# Patient Record
Sex: Male | Born: 1935 | Race: Asian | Hispanic: No | Marital: Married | Smoking: Former smoker
Health system: Southern US, Community
[De-identification: ages and names within clinical notes are randomized; demographics above are authoritative.]

## PROBLEM LIST (undated history)

## (undated) DIAGNOSIS — C61 Malignant neoplasm of prostate: Secondary | ICD-10-CM

## (undated) DIAGNOSIS — I712 Thoracic aortic aneurysm, without rupture, unspecified: Secondary | ICD-10-CM

## (undated) DIAGNOSIS — I639 Cerebral infarction, unspecified: Secondary | ICD-10-CM

## (undated) DIAGNOSIS — I7100 Dissection of unspecified site of aorta: Secondary | ICD-10-CM

## (undated) DIAGNOSIS — I10 Essential (primary) hypertension: Secondary | ICD-10-CM

## (undated) HISTORY — DX: Thoracic aortic aneurysm, without rupture, unspecified: I71.20

## (undated) HISTORY — PX: OTHER SURGICAL HISTORY: SHX169

## (undated) HISTORY — DX: Essential (primary) hypertension: I10

## (undated) HISTORY — DX: Cerebral infarction, unspecified: I63.9

## (undated) HISTORY — DX: Thoracic aortic aneurysm, without rupture: I71.2

## (undated) HISTORY — DX: Malignant neoplasm of prostate: C61

## (undated) HISTORY — PX: EYE SURGERY: SHX253

## (undated) HISTORY — DX: Dissection of unspecified site of aorta: I71.00

---

## 1994-08-27 DIAGNOSIS — I639 Cerebral infarction, unspecified: Secondary | ICD-10-CM

## 1994-08-27 HISTORY — DX: Cerebral infarction, unspecified: I63.9

## 1998-09-26 ENCOUNTER — Other Ambulatory Visit: Admission: RE | Admit: 1998-09-26 | Discharge: 1998-09-26 | Payer: Self-pay | Admitting: Obstetrics & Gynecology

## 1999-02-20 ENCOUNTER — Emergency Department (HOSPITAL_COMMUNITY): Admission: EM | Admit: 1999-02-20 | Discharge: 1999-02-20 | Payer: Self-pay | Admitting: Emergency Medicine

## 1999-05-26 ENCOUNTER — Ambulatory Visit (HOSPITAL_COMMUNITY): Admission: RE | Admit: 1999-05-26 | Discharge: 1999-05-26 | Payer: Self-pay | Admitting: *Deleted

## 2000-10-09 ENCOUNTER — Encounter: Payer: Self-pay | Admitting: Urology

## 2000-10-10 ENCOUNTER — Ambulatory Visit (HOSPITAL_COMMUNITY): Admission: RE | Admit: 2000-10-10 | Discharge: 2000-10-10 | Payer: Self-pay | Admitting: Urology

## 2000-10-10 ENCOUNTER — Encounter: Payer: Self-pay | Admitting: Urology

## 2000-10-18 ENCOUNTER — Encounter: Admission: RE | Admit: 2000-10-18 | Discharge: 2000-10-18 | Payer: Self-pay | Admitting: Urology

## 2000-10-18 ENCOUNTER — Encounter: Payer: Self-pay | Admitting: Urology

## 2002-09-23 ENCOUNTER — Encounter: Payer: Self-pay | Admitting: Internal Medicine

## 2002-09-23 ENCOUNTER — Encounter: Admission: RE | Admit: 2002-09-23 | Discharge: 2002-09-23 | Payer: Self-pay | Admitting: Internal Medicine

## 2004-08-24 ENCOUNTER — Emergency Department (HOSPITAL_COMMUNITY): Admission: EM | Admit: 2004-08-24 | Discharge: 2004-08-24 | Payer: Self-pay | Admitting: Emergency Medicine

## 2006-05-24 ENCOUNTER — Ambulatory Visit (HOSPITAL_COMMUNITY): Admission: RE | Admit: 2006-05-24 | Discharge: 2006-05-24 | Payer: Self-pay | Admitting: *Deleted

## 2006-05-24 ENCOUNTER — Encounter (INDEPENDENT_AMBULATORY_CARE_PROVIDER_SITE_OTHER): Payer: Self-pay | Admitting: Specialist

## 2007-07-27 ENCOUNTER — Ambulatory Visit: Payer: Self-pay | Admitting: Thoracic Surgery (Cardiothoracic Vascular Surgery)

## 2007-07-27 ENCOUNTER — Ambulatory Visit: Payer: Self-pay | Admitting: Cardiology

## 2007-07-27 ENCOUNTER — Inpatient Hospital Stay (HOSPITAL_COMMUNITY): Admission: EM | Admit: 2007-07-27 | Discharge: 2007-08-05 | Payer: Self-pay | Admitting: Emergency Medicine

## 2007-07-28 ENCOUNTER — Encounter (INDEPENDENT_AMBULATORY_CARE_PROVIDER_SITE_OTHER): Payer: Self-pay | Admitting: Cardiology

## 2007-09-29 ENCOUNTER — Encounter
Admission: RE | Admit: 2007-09-29 | Discharge: 2007-09-29 | Payer: Self-pay | Admitting: Thoracic Surgery (Cardiothoracic Vascular Surgery)

## 2007-09-29 ENCOUNTER — Ambulatory Visit: Payer: Self-pay | Admitting: Thoracic Surgery (Cardiothoracic Vascular Surgery)

## 2008-04-19 ENCOUNTER — Ambulatory Visit: Payer: Self-pay | Admitting: Thoracic Surgery (Cardiothoracic Vascular Surgery)

## 2008-04-19 ENCOUNTER — Encounter
Admission: RE | Admit: 2008-04-19 | Discharge: 2008-04-19 | Payer: Self-pay | Admitting: Thoracic Surgery (Cardiothoracic Vascular Surgery)

## 2008-08-05 ENCOUNTER — Ambulatory Visit (HOSPITAL_COMMUNITY): Admission: RE | Admit: 2008-08-05 | Discharge: 2008-08-05 | Payer: Self-pay | Admitting: *Deleted

## 2008-08-05 ENCOUNTER — Encounter (INDEPENDENT_AMBULATORY_CARE_PROVIDER_SITE_OTHER): Payer: Self-pay | Admitting: *Deleted

## 2008-10-25 ENCOUNTER — Ambulatory Visit: Payer: Self-pay | Admitting: Thoracic Surgery (Cardiothoracic Vascular Surgery)

## 2008-10-25 ENCOUNTER — Encounter
Admission: RE | Admit: 2008-10-25 | Discharge: 2008-10-25 | Payer: Self-pay | Admitting: Thoracic Surgery (Cardiothoracic Vascular Surgery)

## 2009-11-21 ENCOUNTER — Encounter
Admission: RE | Admit: 2009-11-21 | Discharge: 2009-11-21 | Payer: Self-pay | Admitting: Thoracic Surgery (Cardiothoracic Vascular Surgery)

## 2009-11-21 ENCOUNTER — Ambulatory Visit: Payer: Self-pay | Admitting: Thoracic Surgery (Cardiothoracic Vascular Surgery)

## 2010-06-21 ENCOUNTER — Ambulatory Visit: Payer: Self-pay | Admitting: Cardiovascular Disease

## 2010-06-22 ENCOUNTER — Ambulatory Visit: Payer: Self-pay | Admitting: Cardiovascular Disease

## 2010-06-27 ENCOUNTER — Encounter: Admission: RE | Admit: 2010-06-27 | Discharge: 2010-06-27 | Payer: Self-pay | Admitting: Cardiovascular Disease

## 2010-09-17 ENCOUNTER — Encounter: Payer: Self-pay | Admitting: Thoracic Surgery (Cardiothoracic Vascular Surgery)

## 2010-10-19 ENCOUNTER — Other Ambulatory Visit: Payer: Self-pay | Admitting: Thoracic Surgery (Cardiothoracic Vascular Surgery)

## 2010-10-19 DIAGNOSIS — I719 Aortic aneurysm of unspecified site, without rupture: Secondary | ICD-10-CM

## 2010-11-06 ENCOUNTER — Other Ambulatory Visit: Payer: Self-pay

## 2010-11-06 ENCOUNTER — Encounter: Payer: Self-pay | Admitting: Thoracic Surgery (Cardiothoracic Vascular Surgery)

## 2010-12-08 ENCOUNTER — Other Ambulatory Visit: Payer: Self-pay | Admitting: Thoracic Surgery (Cardiothoracic Vascular Surgery)

## 2010-12-08 DIAGNOSIS — I719 Aortic aneurysm of unspecified site, without rupture: Secondary | ICD-10-CM

## 2010-12-11 ENCOUNTER — Ambulatory Visit
Admission: RE | Admit: 2010-12-11 | Discharge: 2010-12-11 | Disposition: A | Discharge status: Home / Self Care | Payer: Medicare Other | Source: Ambulatory Visit | Attending: Thoracic Surgery (Cardiothoracic Vascular Surgery) | Admitting: Thoracic Surgery (Cardiothoracic Vascular Surgery)

## 2010-12-11 ENCOUNTER — Encounter (INDEPENDENT_AMBULATORY_CARE_PROVIDER_SITE_OTHER): Payer: Medicare Other | Admitting: Thoracic Surgery (Cardiothoracic Vascular Surgery)

## 2010-12-11 DIAGNOSIS — I719 Aortic aneurysm of unspecified site, without rupture: Secondary | ICD-10-CM

## 2010-12-11 DIAGNOSIS — I7101 Dissection of thoracic aorta: Secondary | ICD-10-CM

## 2010-12-11 MED ORDER — IOHEXOL 350 MG/ML SOLN
100.0000 mL | Freq: Once | INTRAVENOUS | Status: AC | PRN
  Administered 2010-12-11: 100 mL via INTRAVENOUS

## 2010-12-12 NOTE — Assessment & Plan Note (Signed)
OFFICE VISIT  Dwayne Morris, Dwayne Morris DOB:  19-May-1927                                        December 11, 2010 CHART #:  [id number]  The patient comes in today for 1-year followup.  He is status post an acute type B aortic dissection in November 2008.  He was last seen in our office on November 21, 2009.  He has remained stable since his last office visit.  He sees Dr. Elease Hashimoto about every 6 months and his blood pressures have been very well controlled, so much so that he has reduced his antihypertensive medication regimen to just labetalol at this point. He denies any chest discomfort or shortness of breath.  He does have complaint of some vague back discomfort, but this is variable not associated with any specific activity and is nonspecific to any certain area in his back.  He remains active, swimming and walking every other day.  PHYSICAL EXAMINATION:  Vital Signs:  Blood pressure is 117/65, pulse is 58, respirations 16.  Heart:  Regular rate and rhythm without murmurs, rubs or gallops.  Lungs:  Clear to auscultation.  Abdomen:  Soft and nontender.  Extremities:  Pulses are palpable in his lower extremities with no significant lower extremity edema bilaterally.  CT angiogram of the chest today shows complete resolution of his type B dissection.  There is no change in the size of the aorta since his last scan.  ASSESSMENT AND PLAN:  The patient has remained stable from his aortic dissection.  Dr. [name] saw the patient today and reviewed his films.  The vague back discomfort he has been experiencing does not sound to be related to his aorta at all.  His blood pressures are well controlled and he remains active and asymptomatic.  We will plan to see him back in 2 years with a repeat CT angiogram at that time to follow up on his tight B dissection.  Coral Ceo, P.A.  GC/MEDQ  D:  12/11/2010  T:  12/12/2010  Job:  [id number]  cc:   Vesta Mixer, M.D. Massie Maroon,  MD

## 2010-12-18 ENCOUNTER — Other Ambulatory Visit: Payer: Self-pay | Admitting: *Deleted

## 2010-12-18 DIAGNOSIS — E785 Hyperlipidemia, unspecified: Secondary | ICD-10-CM

## 2010-12-18 MED ORDER — CHOLINE FENOFIBRATE 135 MG PO CPDR: 135.0000 mg | DELAYED_RELEASE_CAPSULE | Freq: Every day | ORAL | Status: DC

## 2010-12-18 NOTE — Telephone Encounter (Signed)
Fax received from pharmacy. Refill completed. Jodette Skai Lickteig RN  

## 2011-01-09 NOTE — Discharge Summary (Signed)
NAMERICHARDSON, Dwayne Morris                  ACCOUNT NO.:  [id number]   MEDICAL RECORD NO.:  [id number]          PATIENT TYPE:  INP   LOCATION:  2010                         FACILITY:  [facility]   PHYSICIAN:  Vesta Mixer, M.D. DATE OF BIRTH:  1927/05/25   DATE OF ADMISSION:  07/27/2007  DATE OF DISCHARGE:                               DISCHARGE SUMMARY   DISCHARGE DIAGNOSES:  1. Aortic dissection - type 3.  2. Hypertension.  3. 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  4. Mild dyslipidemia.   DISCHARGE MEDICATIONS:  1. Lipitor 20 mg a day.  2. Aspirin 81 mg a day.  3. Labetalol 200 mg three times a day.  4. Amlodipine 2.5 mg a day.  5. Darvocet N-100 one tablet twice a day as needed for pain.  6. MiraLax 17 grams at night or Metamucil one spoon in water at      bedtime for constipation.   DISPOSITION:  The patient will see Dr. Elease Hashimoto in 1-2 weeks.  He is to  call for an appointment.  He is to see Dr. [name] for follow-up in 4-6  weeks.   HISTORY:  [name] is a 77 year old gentleman with a history of mild  hypertension.  He was admitted to discharge service after having  episodes of chest pain and being discovered to have an aortic  dissection.  Please see dictated H&P for further details.   HOSPITAL COURSE:  #1- AORTIC DISSECTION.  The patient has spiral CT in  the in the emergency room.  He was found to have a dissection which  started at just distal to the left subclavian artery.  It extended down  into the iliac arteries.  The patient was treated aggressively with  labetalol and nitro drip for his blood pressure.  He stabilized, but did  not have any further episodes of pain.  Indeed, there was some initial  thrombosis of the false lumen on the initial CT scan.   Follow-up CT scan performed several days later revealed a marked  improvement of the true lumen with continued thrombosis of the false  lumen.  The patient did quite well and was his  blood pressure and heart  rate stayed quite well-controlled.  On December 8, the patient had some  recurrent episodes of back pain and chest discomfort.  He was monitored  one additional day.  He remained clinically stable and his pulses  remained intact.  He is discharged now in satisfactory condition.  The  patient will follow-up with Dr. Elease Hashimoto in 1-2 weeks.  Follow-up with Dr.  [name] in several weeks.  We anticipate a repeat scan in approximately 6  weeks.   #2 - CHEST PAIN.  The patient had intermittent episodes of chest  discomfort through the hospitalization.  He had serial cardiac enzymes  which were negative.  His EKG also was completely normal.   #3 - AORTIC INSUFFICIENCY.  The patient was incidentally noted to have  aortic insufficiency with an echocardiogram.  This will be followed up  by Dr. Elease Hashimoto.           ______________________________  Vesta Mixer, M.D.     PJN/MEDQ  D:  08/05/2007  T:  08/05/2007  Job:  [id number]   cc:   Salvatore Decent. [name], M.D.  [employer]

## 2011-01-09 NOTE — Op Note (Signed)
Dwayne Morris, CHAVOUS                  ACCOUNT NO.:  [id number]   MEDICAL RECORD NO.:  [id number]          PATIENT TYPE:  AMB   LOCATION:  ENDO                         FACILITY:  [facility]   PHYSICIAN:  Georgiana Spinner, M.D.    DATE OF BIRTH:  10-26-1926   DATE OF PROCEDURE:  DATE OF DISCHARGE:                               OPERATIVE REPORT   PROCEDURE:  Colonoscopy.   INDICATIONS:  Colon polyps, colon cancer screening.   ANESTHESIA:  Fentanyl 12.5 mcg, Versed 1 mg.   PROCEDURE:  With the patient mildly sedated in the left lateral  decubitus position, the Pentax videoscopic pediatric colonoscope was  inserted in the rectum and passed under direct vision with pressure  applied to reach the cecum, identified by the ileocecal valve and  appendiceal orifice, both which were photographed.  From this point the  colonoscope was slowly withdrawn, taking circumferential views of  colonic mucosa, stopping at the head of the splenic flexure area, where  a polyp was seen, photographed, and removed using hot biopsy forceps  technique with a setting of 20/150 blended current.  We next stopped  just a slight distance distal to this in the descending colon, where a  second polyp was seen.  It, too, was photographed and were removed using  snare cautery technique, again at a setting of 20/150 blended current.  Tissue was retrieved by taking a biopsy forceps and retrieving it into  the scope.  The endoscope was then withdrawn all the way to the rectum,  which appeared normal on direct and showed hemorrhoids on retroflexed  view.  The endoscope was straightened and withdrawn.  The patient's  vital signs, pulse oximeter remained stable.  The patient tolerated the  procedure well without apparent complications.   FINDINGS:  Internal hemorrhoids, polyp of the descending colon, polyp of  the splenic flexure.   PLAN:  Await biopsy report.  The patient will call me for results and  follow up with me as an  outpatient.           ______________________________  Georgiana Spinner, M.D.     GMO/MEDQ  D:  08/05/2008  T:  08/05/2008  Job:  [id number]

## 2011-01-09 NOTE — Assessment & Plan Note (Signed)
OFFICE VISIT   Dwayne, Morris  DOB:  02-27-1927                                        November 21, 2009  CHART #:  [id number]   HISTORY:  The patient returns for routine followup and annual  surveillance status post acute type B aortic dissection in November  2008.  He was last seen here in the office on October 25, 2008.  Since  then, he has done well clinically and he specifically reports no new  medical problems or complaints.  He remains active physically and he  enjoys his normal exercise tolerance.  He has not had any problems with  exertional shortness of breath or chest pain.  He has not had any  significant pain in his chest or back that could conceivably be related  to his thoracic aorta.  He otherwise feels quite well and has been  getting along nicely.  He reports that he continues to monitor his blood  pressure and he has not had any problems with blood pressure control.  The remainder of his review of systems is unremarkable.  The remainder  of his past medical history is unchanged.   CURRENT MEDICATIONS:  1. Aspirin 81 mg daily.  2. Labetalol 200 mg 3 times daily.  3. Avodart 0.5 mg daily.  4. Nexium 40 mg daily.  5. Vitamin C and multivitamin daily.  6. Trilipix 1 tablet daily.   PHYSICAL EXAMINATION:  Notable for well-appearing male with blood  pressure 127/65, pulse 60, and oxygen saturation 98% on room air.  HEENT  exam is unrevealing.  Auscultation of the chest reveals clear breath  sounds which are symmetrical bilaterally.  Cardiovascular exam is  notable for regular rate and rhythm.  No murmurs, rubs, or gallops are  noted.  The abdomen is soft and nontender.  Pulses are palpable in all 4  extremities and symmetrical.  The remainder of his physical exam is  noncontributory.   DIAGNOSTIC TESTS:  CT angiogram of the chest and abdomen performed  earlier today at [facility] is reviewed.  This  demonstrates stable  radiographic appearance of the entire thoracic and  abdominal aorta.  There is mild aneurysmal dilatation of the ascending  thoracic aorta and transverse aorta.  There has been no interval change  in size at all with maximum transverse diameter of the proximal aorta  measured between 4.5 and 4.6 cm.  The descending thoracic aorta remains  stable and in particular, there remains no physical sign of the  patient's previous aortic dissection.  The false lumen healed and  obliterated in the past and there remains no evidence that the patient  ever had an aortic dissection as he originally presented with in 2008.  The patient has a stable hemangioma in the liver.  No other  abnormalities are noted.   IMPRESSION:  Stable clinical course and radiographic appearance of the  entire thoracic and abdominal aorta.  There is mild aneurysmal  enlargement of the ascending thoracic aorta and transverse aortic arch  with no interval change in size.  The patient's previous type B aortic  dissection has healed completely.   PLAN:  We will have the patient return in 1 year's time for routine  followup and CT angiogram.   Salvatore Decent. [name], M.D.  Electronically Signed   CHO/MEDQ  D:  11/21/2009  T:  11/21/2009  Job:  [id number]   cc:   Vesta Mixer, M.D.  Massie Maroon, MD

## 2011-01-09 NOTE — H&P (Signed)
Dwayne Morris, Dwayne Morris                  ACCOUNT NO.:  [id number]   MEDICAL RECORD NO.:  [id number]          PATIENT TYPE:  INP   LOCATION:  2906                         FACILITY:  [facility]   PHYSICIAN:  Reginia Forts, MD     DATE OF BIRTH:  [birth year]   DATE OF ADMISSION:  07/27/2007  DATE OF DISCHARGE:                              HISTORY & PHYSICAL   ADDENDUM:  Bedside limited transthoracic echo was performed and  demonstrated an ejection fraction of approximately 60-65% with mild  aortic insufficiency.  A trace localized pericardial effusion is noted  in the subcostal views, but not seen in apical or parasternal views.  There is no evidence of tamponade.      Reginia Forts, MD  Electronically Signed     RA/MEDQ  D:  07/27/2007  T:  07/28/2007  Job:  [id number]

## 2011-01-09 NOTE — Assessment & Plan Note (Signed)
OFFICE VISIT   TEION, BALLIN  DOB:  05-17-1927                                        September 29, 2007  CHART #:  [id number]   HISTORY OF PRESENT ILLNESS:  Mr. Dwayne Morris returns for followup related to  recently discovered acute type B aortic dissection.  He was hospitalized  on July 27, 2007, with sudden onset of chest pain.  Chest CT scan  revealed an acute type B aortic dissection.  He has been treated  medically and done well.  He returns to the office for further followup  today.  He continues to follow up with Dr. Elease Hashimoto, who has been managing  his anti-hypertensive medications.  Mr. Walrath reports feeling well.  He  has not had pain for several weeks.  He is back to fairly normal  activity at this point although he has been careful to avoid any  straining or strenuous activity.  He has been keeping close monitor of  his blood pressure control and he has done well.  Overall he feels well  and has no complaints.  The remainder of his review of systems is  notable for the absence of any pain in the chest or back.  He has not  had any shortness of breath.  His past medical history is unchanged from  previously.   CURRENT MEDICATIONS:  1. Labetalol 200 mg 3 times daily.  2. Amlodipine 2.5 mg daily.  3. Aspirin 81 mg daily.  4. Lipitor 20 mg daily.   PHYSICAL EXAMINATION:  GENERAL:  Notable for a well-appearing male.  VITAL SIGNS:  Blood pressure 104/58, pulse 60, oxygen saturation 94% on  room air.  HEENT:  Unrevealing.  CHEST:  Auscultation of the chest demonstrates clear breath sounds which  are symmetrical bilaterally.  CARDIOVASCULAR:  Reveals regular rate and rhythm.  No murmurs, rubs or  gallops are noted.  ABDOMEN:  Soft, nontender, nondistended.  EXTREMITIES:  Pulses are easily palpable in all 4 extremities with  palpable pulses in the dorsalis pedis and the posterior tibial position.   The remainder of his physical examination is  unrevealing.   DIAGNOSTIC TESTS:  CT angiogram of the thoracic and abdominal aorta  performed today is reviewed and compared with the previous scan from  July 31, 2007.  Today's scan demonstrates stable radiographic  appearance of type B aortic dissection with further thrombosis of  portions of the false lumen of the descending thoracic aorta.  There has  been no sign of any aneurysmal enlargement.  One can appreciate several  areas where contrast penetrates through intimal tears into the false  lumen, but the majority of the false lumen has now thrombosed.  There is  mild-to-moderate stable aneurysmal enlargement of the ascending thoracic  aorta which measures 4.8 cm in its greatest transverse diameter.  No  other abnormalities are noted.   IMPRESSION:  Stable radiographic appearance of type B (DeBakey type III)  aortic dissection.  Mr. Motyka is doing well in medical therapy.  His  blood pressure is under good control.   PLAN:  We will plan to see Mr. Matusek back for further followup and a  repeat CT angiogram in 6 months.  He has been counseled regarding the  need to continue to closely monitor his blood pressure indefinitely.  I  think he can  start to progress with physical activity, although I have  advised him to avoid any heavy lifting or straining indefinitely in the  future.  His blood pressure remains under good control, and we will  continue to defer his long-term medical treatment to Dr. Elease Hashimoto and Dr.  Selena Batten.   Salvatore Decent. [name], M.D.  Electronically Signed   CHO/MEDQ  D:  09/29/2007  T:  09/29/2007  Job:  [id number]   cc:   Vesta Mixer, M.D.  Dr. Selena Batten

## 2011-01-09 NOTE — Consult Note (Signed)
NAMEDAMION, KANT                  ACCOUNT NO.:  [id number]   MEDICAL RECORD NO.:  [id number]          PATIENT TYPE:  INP   LOCATION:  2906                         FACILITY:  [facility]   PHYSICIAN:  Reginia Forts, MD     DATE OF BIRTH:  15-May-1927   DATE OF CONSULTATION:  DATE OF DISCHARGE:                                 CONSULTATION   CHIEF COMPLAINT:  Back pain.   HISTORY OF PRESENT ILLNESS:  Mr. Noble is a 77 year old [place] male with  a history of CVA who presents with 5 hours of mid back and chest pain.  Patient denies any prior symptoms.  Today, upon returning home from  church in the afternoon, patient developed 8-9 out of 10 back pain  radiating to his mid sternum.  Patient thought that this may be a heart  attack and called EMS.  He was subsequently transported to the [facility] Emergency Room.  Initial cardiac enzymes and EKG were unremarkable.  CT chest and abdomen demonstrated a type III aortic dissection with  thrombus in the false lumen.  Dissection extends from just proximal to  the subclavian artery down to the iliacs.  Based on these findings, CT  surgery was consulted along with the hospitalists.  After discussion,  the decision was made for the patient to be admitted to cardiology for  close hemodynamic monitoring and aggressive blood pressure control with  CT surgery following as consultants.  Patient was placed on  nitroglycerin drip with improvement in his symptoms, down to 1 out of  10.  Patient denies any abdominal pain, weakness, loss of consciousness,  orthopnea, paroxysmal nocturnal dyspnea, or shortness of breath.   PAST MEDICAL HISTORY:  1. Stroke in 1996 with residual mild left leg numbness.  2. History of right frontal scalp trauma secondary in 2005.  3. History of nephrolithiasis.  4. Colonic polyps.   ALLERGIES:  No known drug allergies.   MEDICATIONS:  Aspirin 81 mg.   SOCIAL HISTORY:  Patient lives in [place] with his wife.  He is a  retired Pharmacist, hospital.  He has 2 children, a son who is a  physician and a daughter who is a professor at [employer].  Denies any tobacco use, he has occasional wine, and denies any drug use.  He swims everyday for the last 30 years.   FAMILY HISTORY:  Negative for any cardiovascular disease.   REVIEW OF SYSTEMS:  Notable for mild left leg numbness.  Rest of the 12  review of systems were reviewed and is negative.   PHYSICAL EXAM:  Temperature 97.4.  Pulse 62.  Respiratory rate 18.  Blood pressure 128/66.  In general, patient is awake, alert, oriented x3, in no acute distress,  pleasant and communicative.  HEENT:  Normocephalic, atraumatic.  Pupils:  Equal, round, and reactive  to light.  Extraocular movements are intact.  Neck shows no bruits, no JVD with brisk carotid upstrokes.  Lymph demonstrates no lymphadenopathy.  CARDIOVASCULAR:  Regular rhythm, normal rate with a 1 out of 6 systolic  ejection murmur at  the right upper sternal border.  There is no  diastolic murmur.  Lungs are clear to auscultation bilaterally.  ABDOMEN:  Positive bowel sounds.  Soft, nontender, nondistended.  Extremities show no cyanosis, clubbing, or edema.  There are 3+ femoral  pulses and 3+ distal pulses with no subclavian bruits.  Musculoskeletal demonstrates no joint deformities or effusions.  Skin demonstrates no rashes or lesions.  NEURO:  Cranial nerves II-XII grossly intact.  No focal motor or sensory  deficits.  Psych demonstrates normal affect.   DATA:  Chest x-ray demonstrates cardiomegaly.  CT of the chest and  abdomen demonstrates a type III aortic dissection from the proximal  transverse thoracic aorta to the iliacs with a thrombosed false lumen  with severe compression of the contrast-filled lumen in the distal  thoracic and proximal and mid abdominal aorta.  The true lumen is patent  in that region.  An ascending aortic aneurysm is also noted with a  measurement of  4.7 cm.  EKG demonstrates normal sinus rhythm with a  heart rate of 75 with no ST-T wave changes or hypertrophy.   LABS:  BUN 12, creatinine 0.9.  White count of 9.9, hemoglobin of 16,  and platelet of 205,000.  Troponin was less than 0.05.  CK is 66.2, MB  is 1.4.   ASSESSMENT/PLAN:  This is a 77 year old [place] male with a newly  diagnosed type III aortic dissection, which appears to be stable but  quite significant.  Dissection:  Patient will be treated conservatively.  At this time,  there is no acute surgical indication.  CT surgery will follow.  Patient  will be continued on nitroglycerin drip and started on labetalol p.o.  and a Statin.  Patient will also continue his aspirin.  We will obtain  an echocardiogram to rule out aortic regurgitation and/or a evolving  pericardial effusion.  Patient will be admitted to the CCU for close  monitoring and aggressive blood pressure control.      Reginia Forts, MD  Electronically Signed     RA/MEDQ  D:  07/27/2007  T:  07/28/2007  Job:  [id number]

## 2011-01-09 NOTE — Consult Note (Signed)
NAMEANNE, SEBRING                  ACCOUNT NO.:  [id number]   MEDICAL RECORD NO.:  [id number]          PATIENT TYPE:  EMS   LOCATION:  MAJO                         FACILITY:  [facility]   PHYSICIAN:  Salvatore Decent. [name], M.D. DATE OF BIRTH:  October 23, 1926   DATE OF CONSULTATION:  07/27/2007  DATE OF DISCHARGE:                                 CONSULTATION   REQUESTING PHYSICIAN:  Dr. Benjiman Core in the emergency department.   PRIMARY CARE PHYSICIAN:  Dr. Selena Batten at [facility].   REASON FOR CONSULTATION:  Acute type B aortic dissection.   HISTORY OF PRESENT ILLNESS:  Mr. 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  The patient  otherwise remains remarkably active physically.  He has been in his  usual state of health until approximately 2:00 this afternoon, when he  developed sudden onset of severe piercing chest pain radiating straight  through to his back.  The pain increased in severity, became associated  with shortness of breath.  The patient took a few aspirin and called  EMS, and was brought directly to the emergency department.  Upon  arrival, blood pressure was 157/71 with pulse 69.  Baseline  electrocardiograms were without acute ischemic changes and cardiac  enzymes are not elevated.  CT angiogram of the chest was performed,  demonstrating acute type B aortic dissection.  Cardiothoracic surgical  consultation was requested.   REVIEW OF SYSTEMS:  The patient reports absolutely no problems until his  sudden onset of pain at 2 p.m. this afternoon.  The patient's appetite  had been normal recently.  He remains quite active physically and  exercises regularly, including swimming and long walks.  The patient  specifically denies any previous history of chest pain, chest  tightness,  chest pressure or shortness of breath either with activity or at rest.  The patient denies any fevers or chills.  His appetite has been normal.  His bowel function has been normal.  He has no other complaints.   PAST MEDICAL HISTORY:  1. Borderline hypertension.  2. Dissection of right vertebral artery with associated stroke      approximately 12 years ago.   PAST SURGICAL HISTORY:  None.   SOCIAL HISTORY:  The patient is married and lives with his wife here in  [place].  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  He lives a  very healthy lifestyle.  He has one son who is a Development worker, community at [facility] in [place].  He is a nonsmoker.  He drinks occasional  glass of wine.   MEDICATIONS PRIOR TO ADMISSION:  Aspirin 81 mg daily.   DRUG ALLERGIES:  None known.   PHYSICAL EXAM:  The patient is well-appearing [place] male who appears  younger than stated age, in no acute distress.  Most recently, blood pressure is 128/66, pulse 62, respirations 14,  unlabored.  He is afebrile.  HEENT EXAM:  Unrevealing.  NECK:  Supple.  There are no carotid bruits.  There is no jugular venous  distention.  Auscultation of chest reveals clear breath sounds which are symmetrical  bilaterally.  No wheezes or rhonchi are demonstrated.  CARDIOVASCULAR EXAM:  Regular rate and rhythm.  No murmurs, rubs or  gallops are noted.  ABDOMEN:  Soft, nontender.  Bowel sounds are present.  EXTREMITIES:  Warm and well-perfused.  There is no lower extremity  edema.  All pulses in all 4 extremities are strong and easily  appreciated including both radial pulses, both femoral pulses, and  dorsalis pedis and posterior tibial pulses in both lower legs.  SKIN:  Clean, dry, healthy-appearing throughout.  RECTAL AND GU EXAMS:  Both deferred.  NEUROLOGIC EXAMINATION:  Grossly nonfocal and symmetrical throughout.   DIAGNOSTIC TESTS:  CT angiogram of the chest performed this afternoon   demonstrates acute type B aortic dissection.  There is moderate  aneurysmal fusiform dilatation of the ascending thoracic aorta with  maximum transverse diameter approximately 4.4 centimeters.  The  dissection begins just after takeoff of the left subclavian artery, and  extends down past the aortic bifurcation.  It appears that the false  lumen may in fact already be thrombosed, or at least very under  opacified with contrast on this injection.  Both renal arteries and all  mesenteric vessels arise from the true lumen.  No other abnormalities  are noted, and in particular, there are no pleural effusions or other  signs complication.  The descending thoracic aorta is not aneurysmal  enlarged.   IMPRESSION:  Acute type B aortic dissection with possible early  thrombosis of the false lumen that extends from the left subclavian to  the level of the aortic bifurcation.  There is no sign of compromise to  any of the mesenteric or renal vessels, and the patient is clinically  stable at present with no signs of these possible ischemic complication.   PLAN:  I recommend hospital admission and intensive care monitoring for  strict blood pressure control with use of beta-blockers as the mainstay  for blood pressure control initially.  Routine 2-D echocardiogram may be  reasonable as well, given the patient's dilated ascending thoracic aorta  and the potential for an associated bicuspid aortic valve, although the  patient does not have a murmur on physical exam.  We will continue to  follow along closely and consider transfer to a facility where the stent  grafting of complicated type B aortic dissections can be accomplished,  should the patient develop any significant complications related to this  condition.  Given the appearance on CT angiogram, I suspect this would  be unlikely, and the overall prognosis is probably relatively favorable.      Salvatore Decent. [name], M.D.  Electronically  Signed     CHO/MEDQ  D:  07/27/2007  T:  07/28/2007  Job:  [id number]   cc:   [facility] Dr. Selena Batten

## 2011-01-09 NOTE — Op Note (Signed)
NAMEGASPARD, ISBELL                  ACCOUNT NO.:  [id number]   MEDICAL RECORD NO.:  [id number]          PATIENT TYPE:  AMB   LOCATION:  ENDO                         FACILITY:  [facility]   PHYSICIAN:  Georgiana Spinner, M.D.    DATE OF BIRTH:  08/07/1927   DATE OF PROCEDURE:  08/05/2008  DATE OF DISCHARGE:                               OPERATIVE REPORT   PROCEDURE:  Upper endoscopy.   INDICATIONS:  GERD.   ANESTHESIA:  Fentanyl 37.5 mcg, Versed 3 mg.   PROCEDURE IN DETAIL:  With the patient mildly sedated in the left  lateral decubitus position the Pentax videoscopic endoscope was inserted  in the mouth, passed under direct vision through the esophagus which  appeared normal except there was a possible small island of Barrett's  photographed and biopsied.  We entered into the stomach, fundus was  erythematous and was photographed and biopsied.  Body and antrum were  normal as was duodenal bulb, second portion of duodenum.  From this  point the endoscope was slowly withdrawn taking circumferential views of  duodenal mucosa until the endoscope was pulled back into the stomach,  placed in retroflexion to view the stomach from below.  The endoscope  was straightened and withdrawn taking circumferential views of remaining  gastric and esophageal mucosa.  The patient's vital signs, pulse  oximeter remained stable.  The patient tolerated procedure well without  apparent complications.   FINDINGS:  Erythematous stomach in the fundus biopsied and question of a  small island of Barrett's esophagus biopsied.  Await biopsy report.  The  patient will call me for results and follow up with me as an outpatient.  Proceed to colonoscopy as planned.           ______________________________  Georgiana Spinner, M.D.     GMO/MEDQ  D:  08/05/2008  T:  08/05/2008  Job:  [id number]

## 2011-01-09 NOTE — Assessment & Plan Note (Signed)
OFFICE VISIT   Dwayne Morris, STANCO  DOB:  [birth year]                                        April 19, 2008  CHART #:  [id number]   HISTORY OF PRESENT ILLNESS:  The patient returns for routine followup  and surveillance of type B aortic dissection.  He was originally  hospitalized in November 2008 with acute type B aortic dissection.  Since then, we have been following him with serial CT angiograms.  His  last visit was in February 2009.  Since then, the patient has continued  to do well clinically.  He reports that he has had this vague mild  discomfort in his back that has persisted ever since his original acute  presentation.  He otherwise has been asymptomatic, and his original  problems with pain resolved.  He is back to reasonably normal activity.  He avoids any heavy lifting or straining.  He is back to swimming and  walking on a regular basis.  He is getting along quite well.  He has had  no interval new problems or complaints other than the fact that he has  developed elevated PSA level and he has seen Dr. Bjorn Pippin and  tentatively plans to undergo biopsy of his prostate gland.   CURRENT MEDICATIONS:  Labetalol 200 mg 3 times daily, aspirin 81 mg  daily, and Lipitor 20 mg daily.   PHYSICAL EXAMINATION:  GENERAL:  A well-appearing [place] gentleman.  VITAL SIGNS:  Blood pressure 117/70, pulse 72 and regular.  HEENT:  Unrevealing.  CHEST:  Auscultation of the chest demonstrates clear breath sounds which  are symmetrical.  CARDIOVASCULAR:  Regular rate and rhythm.  No murmurs, rubs, or gallops  are noted.  ABDOMEN:  Soft, nontender.  EXTREMITIES:  Warm and well perfused.  Pulses are palpable.   DIAGNOSTIC TESTS:  CT angiogram performed today is reviewed and compared  with the previous scan from February 2009.  Today's scan demonstrates  stable radiographic appearance of mild aneurysmal enlargement of the  ascending thoracic aorta and entirely stable  type B aortic dissection.  The false lumen thrombosed and now appears to have essentially healed  completely.  There remains a small eccentric outpouching of the proximal  descending thoracic aorta that has not changed in size and appears more  smooth in contour.  This presumably corresponds to the original site of  aortic dissection.  No other abnormalities are noted.   IMPRESSION:  Stable course was mild aneurysmal enlargement of the  ascending thoracic aorta and chronic type B aortic dissection with  thrombosed false lumen that has now essentially healed completely.  Overall, things look quite stable.   PLAN:  We will have the patient return in 6 months for followup CT  angiogram.   Salvatore Decent. [name], M.D.  Electronically Signed   CHO/MEDQ  D:  04/19/2008  T:  04/20/2008  Job:  [id number]   cc:   Vesta Mixer, M.D.  Massie Maroon, MD

## 2011-01-09 NOTE — Assessment & Plan Note (Signed)
OFFICE VISIT   Dwayne Morris, Dwayne Morris  DOB:  10-21-1926                                        October 25, 2008  CHART #:  [id number]   The patient returns to the office today for routine followup and  surveillance having originally presented with acute type B aortic  dissection in November 2008.  He was last seen here in the office on  April 19, 2008.  Since then, he has done well, although he has recently  been diagnosed with prostate cancer.  He otherwise, feels well and he  has no other physical problems or complaints.  He denies any pain in  chest or back that could be related to his aorta.  He has not had any  shortness of breath.  He remains quite active physically both walking  regularly and swimming on a regular basis.  He has no other complaints.  The remainder of his past medical history is unchanged.  The remainder  of his review of systems is unchanged.   CURRENT MEDICATIONS:  Lipitor 20 mg daily, aspirin 81 mg daily,  labetalol 200 mg 3 times daily, Darvocet as needed for pain, MiraLax as  needed for constipation, Avodart 0.5 mg daily, Nexium 40 mg daily,  vitamin C daily, and Mucinex daily as needed.   PHYSICAL EXAMINATION:  GENERAL:  Notable for a well-appearing male.  VITAL SIGNS:  Blood pressure 134/76, pulse 61, and oxygen saturation 94%  on room air.  HEENT:  Unrevealing.  NECK:  Supple.  There are no carotid bruits.  CHEST:  Auscultation of the chest reveals clear breath sounds that are  symmetrical bilaterally.  CARDIOVASCULAR:  Demonstrates regular rate and rhythm.  No murmurs,  rubs, or gallops are noted.  ABDOMEN:  Soft, nontender.  EXTREMITIES:  Warm and well perfused.  Distal pulses are palpable in all  4 extremities.   DIAGNOSTIC TEST:  CT angiogram of the thoracic and abdominal aorta  performed today is reviewed and compared with the last previous exam  from April 19, 2008.  This demonstrates stable radiographic appearance  of the  thoracic and abdominal aorta.  There is mild aneurysmal  enlargement of the ascending thoracic aorta measuring approximately 4.5  cm in his greatest transverse dimension.  The descending thoracic aorta  is normal in caliber.  The old false lumen has thrombosed and healed  completely, and in fact, there is no longer any radiographic evidence  for previous aortic dissection present.  There is some mild ulceration  in the transverse thoracic aorta.   IMPRESSION:  The patient is doing quite well from the standpoint of his  previous aortic dissection.  The false lumen thrombosed and healed, and  the size of his aorta remains stable.  His blood pressure remains under  reasonably good control, although his blood pressure is a little bit  higher today than has been on previous office visits.   PLAN:  We will plan to see the patient, returned for follow up with a  repeat CT angiogram in 1 year.  He has been reminded that it will remain  important for him to keep his blood pressure under good control and keep  an eye on it.  We will leave any further adjustments in his medical  therapy to Dr. Elease Hashimoto and Dr. Selena Batten.   Salvatore Decent.  [name], M.D.  Electronically Signed   CHO/MEDQ  D:  10/25/2008  T:  10/25/2008  Job:  [id number]   cc:   Vesta Mixer, M.D.  Massie Maroon, MD

## 2011-01-12 NOTE — Op Note (Signed)
[facility]  Patient:    Dwayne Morris, Dwayne Morris                         MRN: [id number] Proc. Date: 10/10/00 Adm. Date:  [id number] Attending:  Evlyn Clines CC:         Janae Bridgeman. Eloise Harman., M.D.   Operative Report  PROCEDURES: 1. Cystoscopy. 2. Bilateral retrograde pyelograms with interpretation.  PREOPERATIVE DIAGNOSES: 1. Hematuria. 2. Left distal ureteral stone.  POSTOPERATIVE DIAGNOSES: 1. Hematuria with interval passage of left distal ureteral stone. 2. Benign prostatic hypertrophy.  SURGEON:  Excell Seltzer. Annabell Howells, M.D.  ANESTHESIA:  General.  COMPLICATIONS:  None.  INDICATIONS:  Mr. Herder is a 77 year old [place]-American male who originally presented with hematuria.  Office evaluation suggested a left distal ureteral stone.  The patient had minimal pain, but reported this morning that has had some recent left lower quadrant pain.  A KUB preoperatively on one oblique view suggested the stone was likely still in the ureter; however, it was not 100% clear.  It was felt we should proceed with cystoscopy and retrograde pyelograms to further evaluate the urinary tract.  FINDINGS OF PROCEDURE:  The patient was given p.o. Tequin.  He was taken to the operating room, where general anesthetic was induced.  He was placed in the lithotomy position, his perineum and genitalia were prepped with Betadine solution and he was draped in the usual sterile fashion.  Cystoscopy was performed using a 22-French scope and 12-degree and 70-degree lenses.  Examination revealed a normal urethra and intact external sphincter.  The prostatic urethra was 3-4 cm in length, with lateral lobe hyperplasia and obstruction.  There was minimal middle lobe.  Examination of the bladder revealed the ureteral orifices in the normal anatomic position.  There was moderate trabeculation of the bladder wall.  No tumors, stones or inflammation were noted.  The left ureteral orifice  was cannulated with a 5-French opening catheter; contrast was instilled in retrograde fashion.  There was no obvious filling defect in the distal ureter, where the stone had been noted on IVP; however, there was a mobile filling defect in the mid ureter, it was felt to be most likely bubbles (although a small free-floating stone could not be entirely ruled out).  The remainder of the ureter was unremarkable.  The intrauterine collecting system was delicate without hydronephrosis or filling defects.  After completion of pyelogram, observation of the system demonstrated free drainage.  The mobile filling defect moved quite readily, and I felt was most suspicious for a bubble.  Once again, a stone cannot be entirely ruled out.  The right system was studied as well because of the patients hematuria.  This revealed a normal ureter and intrarenal collecting system without filling defects, hydronephrosis or other abnormalities.  Two calcifications in the right upper quadrant, which are nonurinary.  Because of the filling defect in the left ureter, I felt that it would be worthwhile to pass the basket just to ensure no obvious stones were there.  A ______ Hermelinda Medicus basket with a filiform tip was passed easily to the kidney. The basket was opened and withdrawn with a twisting motion.  A small amount of tissue debris was removed with the basket, but no stone was retrieved.  The second pass was made once again with a small amount of tissue debris, but no stone.  At this point the bladder was drained.  The patients anesthetic was reversed. He  was moved to the recovery room in stable condition.  There were no complications. DD:  10/10/00 TD:  10/10/00 Job: [id number]

## 2011-01-12 NOTE — Op Note (Signed)
NAMEQUANTRELL, Dwayne Morris                  ACCOUNT NO.:  [id number]   MEDICAL RECORD NO.:  [id number]          PATIENT TYPE:  AMB   LOCATION:  ENDO                         FACILITY:  [facility]   PHYSICIAN:  Georgiana Spinner, M.D.    DATE OF BIRTH:  01-07-1927   DATE OF PROCEDURE:  DATE OF DISCHARGE:                                 OPERATIVE REPORT   PROCEDURE:  Colonoscopy.   INDICATIONS:  Colon polyps.   ANESTHESIA:  Fentanyl 70 mcg, Versed 5 mg.   PROCEDURE:  With the patient mildly sedated in the left lateral decubitus  position, the Olympus videoscopic colonoscope was inserted into the rectum  after a rectal examination revealed a large smooth prostate.  Subsequently,  with pressure applied to the abdomen, the patient rolled to the back, right  side and back to his left side, we were able with pressure to reach the  cecum identified by the base of the cecum and ileocecal valve.  At the base  of cecum was a small polyp that was removed using hot biopsy forceps  technique, setting of 20/200 blended current.  We next stopped in the  ascending colon where three other polyps were seen and removed, one with  snare cautery technique and the others with hot biopsy forceps technique,  all with the same setting of 20/200 blended current.  All of the  tissue was  retrieved for pathology and placed in one specimen container.  We then  withdrew the colonoscope, taking circumferential views of the remaining  colonic mucosa, stopping only in the rectum which appeared normal on direct  and showed hemorrhoids on retroflexed view.  The endoscope was straightened  and withdrawn.  The patient's vital signs and pulse oximeter remained  stable.  The patient tolerated procedure well without apparent  complications.   FINDINGS:  Polyps of the cecum and right colon, await biopsy report.  The  patient will call me for results and follow up with me as an outpatient.  Internal hemorrhoids also noted.     ______________________________  Georgiana Spinner, M.D.     GMO/MEDQ  D:  05/24/2006  T:  05/26/2006  Job:  [id number]

## 2011-01-12 NOTE — H&P (Signed)
Dwayne Morris, Dwayne Morris                  ACCOUNT NO.:  [id number]   MEDICAL RECORD NO.:  [id number]          PATIENT TYPE:  INP   LOCATION:  2010                         FACILITY:  [facility]   PHYSICIAN:  Darryl D. Prime, MD    DATE OF BIRTH:  [birth year]   DATE OF ADMISSION:  07/27/2007  DATE OF DISCHARGE:  08/05/2007                              HISTORY & PHYSICAL   PHONE NOTE  The patient called at approximately 11:30 p.m. on August 27, 2007 with  concerns with elevated blood pressure.  Apparently he had a family  feud and was very angry this evening.  He notes his blood pressure a  few minutes before calling was 155/62.  He has a history of type B  aortic dissection, per the patient.  Per the discharge summary, on  August 05, 2007, he has a type 3 aortic dissection.  The patient is on  amlodipine 2.5 mg daily and labetalol 200 mg 3 times a day.  He took the  labetalol around 7 p.m. earlier today.  The patient was instructed to  take an additional 200 mg of labetalol and check his blood pressure an  hour later and call.  He did call approximately 5 minutes after 1 and  notes his blood pressure was 110/57.  He still will continue to monitor  this, particularly if he remains upset, but if his blood pressure  remains well controlled he will continue his regimen as previously  prescribed.      Darryl D. Prime, MD  Electronically Signed     DDP/MEDQ  D:  08/28/2007  T:  08/28/2007  Job:  [id number]

## 2011-05-16 ENCOUNTER — Encounter: Payer: Self-pay | Admitting: *Deleted

## 2011-05-25 ENCOUNTER — Ambulatory Visit: Payer: Medicare Other | Admitting: Cardiovascular Disease

## 2011-06-04 LAB — CARDIAC PANEL(CRET KIN+CKTOT+MB+TROPI)
Relative Index: 1.4
Relative Index: INVALID
Total CK: 53
Troponin I: 0.01
Troponin I: 0.02

## 2011-06-04 LAB — COMPREHENSIVE METABOLIC PANEL
ALT: 35
CO2: 27
Calcium: 8.5
Creatinine, Ser: 0.71
GFR calc non Af Amer: >60
Glucose, Bld: 171 — ABNORMAL HIGH
Sodium: 137

## 2011-06-04 LAB — BASIC METABOLIC PANEL
BUN: 13
CO2: 26
CO2: 28
Calcium: 9
Chloride: 105
Creatinine, Ser: 0.84
Creatinine, Ser: 0.93
GFR calc Af Amer: >60
Glucose, Bld: 106 — ABNORMAL HIGH

## 2011-06-04 LAB — LIPID PANEL: Triglycerides: 54

## 2011-06-04 LAB — B-NATRIURETIC PEPTIDE (CONVERTED LAB): Pro B Natriuretic peptide (BNP): 53

## 2011-06-04 LAB — CBC
Hemoglobin: 15.1
MCHC: 34.4
MCV: 94.9
RBC: 4.62

## 2011-06-04 LAB — HEMOGLOBIN A1C: Mean Plasma Glucose: 119

## 2011-06-05 ENCOUNTER — Encounter: Payer: Self-pay | Admitting: Cardiovascular Disease

## 2011-06-05 ENCOUNTER — Ambulatory Visit (INDEPENDENT_AMBULATORY_CARE_PROVIDER_SITE_OTHER): Payer: Medicare Other | Admitting: Cardiovascular Disease

## 2011-06-05 VITALS — BP 106/62 | HR 61 | Ht 64.0 in | Wt 144.8 lb

## 2011-06-05 DIAGNOSIS — I7100 Dissection of unspecified site of aorta: Secondary | ICD-10-CM

## 2011-06-05 DIAGNOSIS — I71019 Dissection of thoracic aorta, unspecified: Secondary | ICD-10-CM | POA: Insufficient documentation

## 2011-06-05 DIAGNOSIS — I7101 Dissection of thoracic aorta: Secondary | ICD-10-CM

## 2011-06-05 DIAGNOSIS — E785 Hyperlipidemia, unspecified: Secondary | ICD-10-CM

## 2011-06-05 LAB — POCT CARDIAC MARKERS
Myoglobin, poc: 66.2
Operator id: 272551

## 2011-06-05 LAB — CBC
HCT: 44.5
MCV: 93.9
RBC: 4.73
WBC: 9.9

## 2011-06-05 LAB — DIFFERENTIAL
Eosinophils Absolute: 0 — ABNORMAL LOW
Eosinophils Relative: 0
Lymphs Abs: 2
Monocytes Relative: 6
Neutrophils Relative %: 73

## 2011-06-05 LAB — I-STAT 8, (EC8 V) (CONVERTED LAB)
Chloride: 103
HCT: 47
Potassium: 3.4 — ABNORMAL LOW
pH, Ven: 7.339 — ABNORMAL HIGH

## 2011-06-05 LAB — POCT I-STAT CREATININE: Creatinine, Ser: 0.9

## 2011-06-05 MED ORDER — LABETALOL HCL 100 MG PO TABS: 100.0000 mg | ORAL_TABLET | Freq: Two times a day (BID) | ORAL | Status: DC

## 2011-06-05 NOTE — Patient Instructions (Signed)
Your physician wants you to follow-up in:6 months You will receive a reminder letter in the mail two months in advance. If you don't receive a letter, please call our office to schedule the follow-up appointment.  Your physician recommends that you return for lab work tomorrow for fasting labs and in 6 months

## 2011-06-05 NOTE — Assessment & Plan Note (Signed)
Dwayne Morris is doing very well. [name] been walking and/or swimming almost every day of week. He has been gradually tapering his labetalol slightly because of some levels of hypertension. He'll hold his labetalol if his systolic blood pressures less than 90.  At this point I would like for him to stand labetalol 100 mg twice a day. This will help prevent him from having another aortic dissection. I'm pleased that he keeps such meticulous records for our review.

## 2011-06-05 NOTE — Progress Notes (Signed)
Dwayne Morris Date of Birth  Feb 22, 1927 [facility] [address]    [address] [place], [place]  [address] [phone]  Fax  [phone]  History of Present Illness:  77 year old with hx of type B  aortic dissection treated medically .  He is very well. He keeps meticulous records regarding his blood pressure. He has been on labetalol and has had very well controlled blood pressure.  Recently his blood pressure levels have been decreasing. He has gradually decreased his dose of labetalol and has kept up with his blood pressure readings continuously.  He walks on a regular basis approximately 3 miles a day.  He is also swimming 3 times a week.  He has not lost any weight. He he eats a very healthy diet and does not eat a lot of salt.  He complains of some tingling particularly in his left foot.  Current Outpatient Prescriptions on File Prior to Visit  Medication Sig Dispense Refill  . Ascorbic Acid (VITAMIN C PO) Take 1 tablet by mouth daily.        Marland Kitchen aspirin 81 MG tablet Take 81 mg by mouth daily.        . Choline Fenofibrate 135 MG capsule Take 1 capsule (135 mg total) by mouth daily.  30 capsule  11  . dutasteride (AVODART) 0.5 MG capsule Take 0.5 mg by mouth daily.        Marland Kitchen esomeprazole (NEXIUM) 40 MG capsule Take 40 mg by mouth daily before breakfast.        . labetalol (NORMODYNE) 200 MG tablet Take 100 mg by mouth 2 (two) times daily.       . multivitamin (THERAGRAN) per tablet Take 1 tablet by mouth daily.        . Folic Acid-Vit B6-Vit B12 (FOLBEE) 2.5-25-1 MG TABS Take 1 tablet by mouth daily.          No Known Allergies  Past Medical History  Diagnosis Date  . Hypertension   . Aortic dissection     type B aortic dissection in November 2008  . Prostate cancer   . Stroke 1996    History of right vertebral artery dissection with stroke    No past surgical history on file.  History  Smoking status  . Former Smoker -- 1.0 packs/day for 30 years  . Types:  Cigarettes  . Quit date: 05/15/1974  Smokeless tobacco  . Not on file    History  Alcohol Use No    No family history on file.  Reviw of Systems:  Reviewed in the HPI.  All other systems are negative.  Physical Exam: BP 106/62  Pulse 61  Ht 5\' 4"  (1.626 m)  Wt 144 lb 12.8 oz (65.681 kg)  BMI 24.85 kg/m2 The patient is alert and oriented x 3.  The mood and affect are normal.   Skin: warm and dry.  Color is normal.    HEENT:   the sclera are nonicteric.  The mucous membranes are moist.  The carotids are 2+ without bruits.  There is no thyromegaly.  There is no JVD.    Lungs: clear.  The chest wall is non tender.    Heart: regular rate with a normal S1 and S2.  There are no murmurs, gallops, or rubs. The PMI is not displaced.     Abdomen: good bowel sounds.  There is no guarding or rebound.  There is no hepatosplenomegaly or tenderness.  There are no  masses.   Extremities:  no clubbing, cyanosis, or edema.  The legs are without rashes.  The distal pulses are intact.   Neuro:  Cranial nerves II - XII are intact.  Motor and sensory functions are intact.    The gait is normal.  ECG:  Assessment / Plan:

## 2011-06-06 ENCOUNTER — Other Ambulatory Visit (INDEPENDENT_AMBULATORY_CARE_PROVIDER_SITE_OTHER): Payer: Medicare Other | Admitting: *Deleted

## 2011-06-06 DIAGNOSIS — E785 Hyperlipidemia, unspecified: Secondary | ICD-10-CM

## 2011-06-06 DIAGNOSIS — I7100 Dissection of unspecified site of aorta: Secondary | ICD-10-CM

## 2011-06-06 DIAGNOSIS — I7101 Dissection of thoracic aorta: Secondary | ICD-10-CM

## 2011-06-06 LAB — HEPATIC FUNCTION PANEL
ALT: 24 U/L (ref 0–53)
AST: 25 U/L (ref 0–37)
Alkaline Phosphatase: 41 U/L (ref 39–117)
Bilirubin, Direct: 0.1 mg/dL (ref 0.0–0.3)
Total Bilirubin: 0.5 mg/dL (ref 0.3–1.2)

## 2011-06-06 LAB — BASIC METABOLIC PANEL
BUN: 26 mg/dL — ABNORMAL HIGH (ref 6–23)
Calcium: 8.7 mg/dL (ref 8.4–10.5)
Creatinine, Ser: 1 mg/dL (ref 0.4–1.5)
GFR: 77.71 mL/min (ref >60.00)
Glucose, Bld: 95 mg/dL (ref 70–99)
Sodium: 141 mEq/L (ref 135–145)

## 2011-06-06 LAB — LIPID PANEL: HDL: 53.5 mg/dL (ref >39.00)

## 2011-06-13 ENCOUNTER — Telehealth: Payer: Self-pay | Admitting: *Deleted

## 2011-06-13 NOTE — Telephone Encounter (Signed)
Message copied by Eugenia Pancoast on Wed Jun 13, 2011  9:41 AM ------      Message from: Clay, Deloris Ping      Created: Sun Jun 10, 2011  9:55 PM       Very good

## 2011-06-13 NOTE — Telephone Encounter (Signed)
Advised of labs and mailed copy 

## 2011-07-18 ENCOUNTER — Other Ambulatory Visit: Payer: Self-pay | Admitting: Cardiovascular Disease

## 2011-07-25 ENCOUNTER — Other Ambulatory Visit: Payer: Self-pay | Admitting: *Deleted

## 2011-07-25 MED ORDER — LABETALOL HCL 100 MG PO TABS: 100.0000 mg | ORAL_TABLET | Freq: Two times a day (BID) | ORAL | Status: DC

## 2011-09-14 ENCOUNTER — Inpatient Hospital Stay (HOSPITAL_COMMUNITY)
Admission: EM | Admit: 2011-09-14 | Discharge: 2011-09-19 | DRG: 921 | Disposition: A | Discharge status: Home / Self Care | Payer: Medicare Other | Attending: Gastroenterology | Admitting: Gastroenterology

## 2011-09-14 ENCOUNTER — Encounter (HOSPITAL_COMMUNITY)
Admission: EM | Disposition: A | Discharge status: Home / Self Care | Payer: Self-pay | Source: Home / Self Care | Attending: Gastroenterology

## 2011-09-14 ENCOUNTER — Encounter (HOSPITAL_COMMUNITY): Payer: Self-pay

## 2011-09-14 DIAGNOSIS — Z7982 Long term (current) use of aspirin: Secondary | ICD-10-CM

## 2011-09-14 DIAGNOSIS — Z8546 Personal history of malignant neoplasm of prostate: Secondary | ICD-10-CM

## 2011-09-14 DIAGNOSIS — Z87891 Personal history of nicotine dependence: Secondary | ICD-10-CM

## 2011-09-14 DIAGNOSIS — Z79899 Other long term (current) drug therapy: Secondary | ICD-10-CM

## 2011-09-14 DIAGNOSIS — Z23 Encounter for immunization: Secondary | ICD-10-CM

## 2011-09-14 DIAGNOSIS — Y849 Medical procedure, unspecified as the cause of abnormal reaction of the patient, or of later complication, without mention of misadventure at the time of the procedure: Secondary | ICD-10-CM | POA: Diagnosis present

## 2011-09-14 DIAGNOSIS — IMO0002 Reserved for concepts with insufficient information to code with codable children: Principal | ICD-10-CM | POA: Diagnosis present

## 2011-09-14 DIAGNOSIS — K922 Gastrointestinal hemorrhage, unspecified: Secondary | ICD-10-CM

## 2011-09-14 DIAGNOSIS — N400 Enlarged prostate without lower urinary tract symptoms: Secondary | ICD-10-CM | POA: Diagnosis present

## 2011-09-14 DIAGNOSIS — I10 Essential (primary) hypertension: Secondary | ICD-10-CM | POA: Diagnosis present

## 2011-09-14 DIAGNOSIS — Z8673 Personal history of transient ischemic attack (TIA), and cerebral infarction without residual deficits: Secondary | ICD-10-CM

## 2011-09-14 DIAGNOSIS — K219 Gastro-esophageal reflux disease without esophagitis: Secondary | ICD-10-CM | POA: Diagnosis present

## 2011-09-14 HISTORY — PX: COLONOSCOPY: SHX5424

## 2011-09-14 LAB — BASIC METABOLIC PANEL
Chloride: 108 mEq/L (ref 96–112)
Creatinine, Ser: 0.75 mg/dL (ref 0.50–1.35)
GFR calc Af Amer: >90 mL/min (ref >90)
Potassium: 4.4 mEq/L (ref 3.5–5.1)
Sodium: 136 mEq/L (ref 135–145)

## 2011-09-14 LAB — TYPE AND SCREEN
ABO/RH(D): AB POS
Antibody Screen: NEGATIVE
Unit division: 0
Unit division: 0

## 2011-09-14 LAB — ABO/RH: ABO/RH(D): AB POS

## 2011-09-14 LAB — CBC
Hemoglobin: 10.1 g/dL — ABNORMAL LOW (ref 13.0–17.0)
RBC: 3.19 MIL/uL — ABNORMAL LOW (ref 4.22–5.81)
WBC: 8.1 10*3/uL (ref 4.0–10.5)

## 2011-09-14 LAB — MRSA PCR SCREENING: MRSA by PCR: NEGATIVE

## 2011-09-14 SURGERY — COLONOSCOPY
Anesthesia: Moderate Sedation

## 2011-09-14 MED ORDER — MIDAZOLAM HCL 5 MG/5ML IJ SOLN
INTRAMUSCULAR | Status: DC | PRN
  Administered 2011-09-14: 2 mg via INTRAVENOUS

## 2011-09-14 MED ORDER — SODIUM CHLORIDE 0.9 % IV SOLN
INTRAVENOUS | Status: DC
  Administered 2011-09-14: 22:00:00 via INTRAVENOUS

## 2011-09-14 MED ORDER — FENTANYL CITRATE 0.05 MG/ML IJ SOLN
INTRAMUSCULAR | Status: AC
  Filled 2011-09-14: qty 2

## 2011-09-14 MED ORDER — MIDAZOLAM HCL 10 MG/2ML IJ SOLN
INTRAMUSCULAR | Status: AC
  Filled 2011-09-14: qty 2

## 2011-09-14 MED ORDER — EPINEPHRINE HCL 0.1 MG/ML IJ SOLN
INTRAMUSCULAR | Status: AC
  Filled 2011-09-14: qty 10

## 2011-09-14 MED ORDER — FENTANYL NICU IV SYRINGE 50 MCG/ML
INJECTION | INTRAMUSCULAR | Status: DC | PRN
  Administered 2011-09-14: 25 ug via INTRAVENOUS
  Administered 2011-09-14: 20 ug via INTRAVENOUS

## 2011-09-14 MED ORDER — SODIUM CHLORIDE 0.9 % IJ SOLN
INTRAMUSCULAR | Status: DC | PRN
  Administered 2011-09-14: 22:00:00

## 2011-09-14 MED ORDER — PEG 3350-KCL-NA BICARB-NACL 420 G PO SOLR
4000.0000 mL | Freq: Once | ORAL | Status: DC
  Filled 2011-09-14: qty 4000

## 2011-09-14 MED ORDER — SODIUM CHLORIDE 0.9 % IV SOLN
INTRAVENOUS | Status: DC
  Administered 2011-09-15 – 2011-09-18 (×5): via INTRAVENOUS

## 2011-09-14 NOTE — H&P (Signed)
Dwayne Morris is an 78 y.o. male.   Chief Complaint: Hematochezia HPI: This is an 78 year old gentleman who is admitted to the hospital for a post polypectomy bleed.  He underwent a colonoscopy yesterday with findings of a 1.5-2 cm sessile cecal polyp.  It was removed in a piecemeal fashion in two passes.  No immediate bleeding occurred and prophylactically three hemoclips were placed across the lesion.  It was technically difficult to place, but I felt that the clips were successful in closing the lesion.  He was well until 3 PM today when he complained of a bout of large hematochezia.  Upon follow up at 5 PM the patient reported 3 other bloody bowel movements and mild dizziness with standing.  At that time he was instructed to present to the hospital for admission.  No complaints of chest pain or SOB.  No dizziness in a recumbent position.  Past Medical History  Diagnosis Date  . Hypertension   . Aortic dissection     type B aortic dissection in November 2008  . Prostate cancer     PSA levels have decreased with garlic therapy  . Stroke 1996    History of right vertebral artery dissection with stroke    History reviewed. No pertinent past surgical history.  History reviewed. No pertinent family history. Social History:  reports that he quit smoking about 37 years ago. His smoking use included Cigarettes. He has a 30 pack-year smoking history. He does not have any smokeless tobacco history on file. He reports that he does not drink alcohol or use illicit drugs.  Allergies: No Known Allergies  No current facility-administered medications on file as of 09/14/2011.   Medications Prior to Admission  Medication Sig Dispense Refill  . Ascorbic Acid (VITAMIN C PO) Take 1 tablet by mouth daily.        Marland Kitchen aspirin 81 MG tablet Take 81 mg by mouth daily.        . Calcium Carbonate-Vitamin D (CALCIUM + D PO) Take 1 tablet by mouth daily.       . Choline Fenofibrate 135 MG capsule Take 135 mg by  mouth daily.      Marland Kitchen dutasteride (AVODART) 0.5 MG capsule Take 0.5 mg by mouth daily.        Marland Kitchen esomeprazole (NEXIUM) 40 MG capsule Take 40 mg by mouth daily before breakfast.        . Folic Acid-Vit B6-Vit B12 (FOLBEE) 2.5-25-1 MG TABS Take 1 tablet by mouth daily.        Marland Kitchen labetalol (NORMODYNE) 100 MG tablet Take 100 mg by mouth 2 (two) times daily.      Marland Kitchen VITAMIN E PO Take 1 tablet by mouth daily.         No results found for this or any previous visit (from the past 48 hour(s)). No results found.  ROS:  As stated above in the HPI, otherwise negative.  General appearance: alert and no distress Resp: clear to auscultation bilaterally GI: soft, non-tender; bowel sounds normal; no masses,  no organomegaly Extremities: extremities normal, atraumatic, no cyanosis or edema Blood pressure 98/56, pulse 64, temperature 96.9 F (36.1 C), temperature source Oral, resp. rate 16, SpO2 97.00%.   Assessment/Plan 1) Post polypectomy bleed.   Shortly after my assessment of the patient he had a bowel movement.  At that time he had a near syncopal event.  He was placed in his bed in a [place] position.  He started  to regain consciousness, but he was weak.  IV fluids were already going in wide open and a second IV line was started.  Two units of O Neg blood was immediately ordered.  After one unit of PRBC his blood pressured improved from the upper 90's to a systolic of 122.  Heart rate never changed from the mid 60's.  He denied any chest pain or SOB, but he was cold.  His admission bed status was changed over from a Stepdown bed to an ICU bed.  Initially he was to have a colonoscopy tomorrow morning, but with the severity of his bleed a colonoscopy will be performed emergently.  Plan: 1) Emergent colonoscopy.  Sayre Mazor D 09/14/2011, 6:41 PM

## 2011-09-14 NOTE — ED Notes (Signed)
Blood Bank contacted for emergency release.

## 2011-09-14 NOTE — ED Notes (Signed)
MD ordered to transfuse blood at 941ml/hr, due to emergent need of pt. VO VBR.

## 2011-09-14 NOTE — ED Notes (Addendum)
Pt arrived to ED at direct admit. Pt was having rectal bleeding and MD was at bedside. MD allowed pt to get up and use restroom, while on toilet pt experienced a near syncopal event and pt was assisted back to bed by MD and EMT and this RN. Pt was placed in reverse trendelenberg, and 2 bags of NS were hung wide open. MD ordered for emergency release blood and ordered for blood to be hung at bolus rate of 999 ml/hr. Pt did not experience any transfusion reactions while in ED. Pt was alert and oriented, but very diaphoretic and weak. MD was at bedside entire time. Pt BP began to rise and pt began to feel less weak after 1st unit infused. 2nd unit was started in ED and pt was then brought up to 2106. Report was called ahead of time to [place], Charity fundraiser.

## 2011-09-14 NOTE — ED Notes (Signed)
Pt is direct admit to be seen for GI bleed. Admitting MD at bedside

## 2011-09-15 DIAGNOSIS — K922 Gastrointestinal hemorrhage, unspecified: Secondary | ICD-10-CM

## 2011-09-15 LAB — CBC
HCT: 32.6 % — ABNORMAL LOW (ref 39.0–52.0)
HCT: 33 % — ABNORMAL LOW (ref 39.0–52.0)
HCT: 33.5 % — ABNORMAL LOW (ref 39.0–52.0)
HCT: 36.6 % — ABNORMAL LOW (ref 39.0–52.0)
Hemoglobin: 11.1 g/dL — ABNORMAL LOW (ref 13.0–17.0)
Hemoglobin: 11.6 g/dL — ABNORMAL LOW (ref 13.0–17.0)
Hemoglobin: 11.7 g/dL — ABNORMAL LOW (ref 13.0–17.0)
Hemoglobin: 12.7 g/dL — ABNORMAL LOW (ref 13.0–17.0)
MCH: 31.9 pg (ref 26.0–34.0)
MCHC: 34 g/dL (ref 30.0–36.0)
MCHC: 34.7 g/dL (ref 30.0–36.0)
MCV: 90.2 fL (ref 78.0–100.0)
MCV: 91.3 fL (ref 78.0–100.0)
Platelets: 152 10*3/uL (ref 150–400)
Platelets: 148 10*3/uL — ABNORMAL LOW (ref 150–400)
Platelets: 159 10*3/uL (ref 150–400)
RBC: 3.62 MIL/uL — ABNORMAL LOW (ref 4.22–5.81)
RBC: 3.66 MIL/uL — ABNORMAL LOW (ref 4.22–5.81)
RBC: 3.67 MIL/uL — ABNORMAL LOW (ref 4.22–5.81)
RBC: 3.71 MIL/uL — ABNORMAL LOW (ref 4.22–5.81)
RBC: 4.02 MIL/uL — ABNORMAL LOW (ref 4.22–5.81)
WBC: 8.1 10*3/uL (ref 4.0–10.5)
WBC: 6.4 10*3/uL (ref 4.0–10.5)

## 2011-09-15 LAB — BASIC METABOLIC PANEL
CO2: 21 mEq/L (ref 19–32)
Calcium: 7.2 mg/dL — ABNORMAL LOW (ref 8.4–10.5)
Chloride: 112 mEq/L (ref 96–112)
Potassium: 3.6 mEq/L (ref 3.5–5.1)
Sodium: 141 mEq/L (ref 135–145)

## 2011-09-15 LAB — GLUCOSE, CAPILLARY: Glucose-Capillary: 81 mg/dL (ref 70–99)

## 2011-09-15 MED ORDER — DUTASTERIDE 0.5 MG PO CAPS
0.5000 mg | ORAL_CAPSULE | Freq: Every day | ORAL | Status: DC
  Administered 2011-09-15 – 2011-09-19 (×5): 0.5 mg via ORAL
  Filled 2011-09-15 (×5): qty 1

## 2011-09-15 MED ORDER — CHLORHEXIDINE GLUCONATE 0.12 % MT SOLN
15.0000 mL | Freq: Two times a day (BID) | OROMUCOSAL | Status: DC
  Administered 2011-09-15 (×2): 15 mL via OROMUCOSAL
  Filled 2011-09-15 (×3): qty 15

## 2011-09-15 MED ORDER — BIOTENE DRY MOUTH MT LIQD
15.0000 mL | Freq: Two times a day (BID) | OROMUCOSAL | Status: DC
  Administered 2011-09-15: 15 mL via OROMUCOSAL

## 2011-09-15 NOTE — Consult Note (Addendum)
Name: Dwayne Morris MRN: [id number] DOB: [birth year]    LOS: 1  PCCM CONSULT  NOTE  History of Present Illness: This is an 78 year old gentleman who is admitted to the hospital for a post polypectomy bleed. He underwent a colonoscopy 09/13/11 with findings of a 1.5-2 cm sessile cecal polyp. It was removed in a piecemeal fashion in two passes. No immediate bleeding occurred and prophylactically three hemoclips were placed across the lesion. It was technically difficult to place, but It was  felt that the clips were successful in closing the lesion. He was well until 3 PM 09/14/11 when he complained of a bout of large hematochezia. Upon follow up at 5 PM on 09/14/11 the patient reported 3 other bloody bowel movements and mild dizziness with standing. At that time he was instructed by GI Dr  Elnoria Howard to present to the hospital for admission. No complaints of chest pain or SOB. In ER he had bowel movement and had near syncope with hypotension. Emergently trnasfused and repeat colonoscopy done 09/14/11 by Dr Elnoria Howard and s/p hemocliiping. No dizziness in a recumbent position. Admiited to ICU and PCCM asked to follow 09/15/11. He is s/p 4 Units PRBC in ER 09/14/11   Lines / Drains:   Cultures:   Antibiotics: none  Tests / Events: 1/18 GIB     Past Medical History  Diagnosis Date  . Hypertension   . Aortic dissection     type B aortic dissection in November 2008  . Prostate cancer     PSA levels have decreased with garlic therapy  . Stroke 1996    History of right vertebral artery dissection with stroke   History reviewed. No pertinent past surgical history. Prior to Admission medications   Medication Sig Start Date End Date Taking? Authorizing Provider  Ascorbic Acid (VITAMIN C PO) Take 1 tablet by mouth daily.     Yes Historical Provider, MD  aspirin 81 MG tablet Take 81 mg by mouth daily.     Yes Historical Provider, MD  atorvastatin (LIPITOR) 20 MG tablet Take 20 mg by mouth at bedtime.   Yes  Historical Provider, MD  Calcium Carbonate-Vitamin D (CALCIUM + D PO) Take 1 tablet by mouth daily.    Yes Historical Provider, MD  Choline Fenofibrate 135 MG capsule Take 135 mg by mouth daily. 12/18/10 12/18/11 Yes Elyn Aquas., MD  dutasteride (AVODART) 0.5 MG capsule Take 0.5 mg by mouth daily.     Yes Historical Provider, MD  esomeprazole (NEXIUM) 40 MG capsule Take 40 mg by mouth daily before breakfast.     Yes Historical Provider, MD  Folic Acid-Vit B6-Vit B12 (FOLBEE) 2.5-25-1 MG TABS Take 1 tablet by mouth daily.     Yes Historical Provider, MD  labetalol (NORMODYNE) 100 MG tablet Take 100 mg by mouth 2 (two) times daily. 07/25/11  Yes Elyn Aquas., MD  Multiple Vitamin (MULITIVITAMIN WITH MINERALS) TABS Take 1 tablet by mouth daily.   Yes Historical Provider, MD  VITAMIN E PO Take 1 tablet by mouth daily.    Yes Historical Provider, MD   Allergies No Known Allergies  Family History History reviewed. No pertinent family history.  Social History  reports that he quit smoking about 37 years ago. His smoking use included Cigarettes. He has a 30 pack-year smoking history. He does not have any smokeless tobacco history on file. He reports that he does not drink alcohol or use illicit drugs.  Review Of Systems  11  points review of systems is negative with an exception of listed in HPI.  Vital Signs: Temp:  [96.3 F (35.7 C)-98 F (36.7 C)] 97.9 F (36.6 C) (01/19 0400) Pulse Rate:  [56-76] 71  (01/19 0700) Resp:  [8-21] 19  (01/19 0700) BP: (38-146)/(27-67) 104/51 mmHg (01/19 0700) SpO2:  [89 %-100 %] 94 % (01/19 0700) Weight:  [141 lb 1.5 oz (64 kg)] 141 lb 1.5 oz (64 kg) (01/18 2010) I/O last 3 completed shifts: In: 5950 [I.V.:4550; Blood:1400] Out: 1900 [Urine:1900]  Physical Examination:  BP 104/51  Pulse 71  Temp(Src) 97.9 F (36.6 C) (Oral)  Resp 19  Ht 5\' 3"  (1.6 m)  Wt 64 kg (141 lb 1.5 oz)  BMI 24.99 kg/m2  SpO2 94%  General Appearance:    Alert,  cooperative, no distress, appears younger than stated age  Head:    Normocephalic, without obvious abnormality, atraumatic  Eyes:    PERRL, conjunctiva/corneas clear, EOM's intact, fundi    benign, both eyes       Ears:    Normal TM's and external ear canals, both ears  Nose:   Nares normal, septum midline, mucosa normal, no drainage   or sinus tenderness  Throat:   Lips, mucosa, and tongue normal; teeth and gums normal  Neck:   Supple, symmetrical, trachea midline, no adenopathy;       thyroid:  No enlargement/tenderness/nodules; no carotid   bruit or JVD  Back:     Symmetric, no curvature, ROM normal, no CVA tenderness  Lungs:     Clear to auscultation bilaterally, respirations unlabored  Chest wall:    No tenderness or deformity  Heart:    Regular rate and rhythm, S1 and S2 normal, no murmur, rub   or gallop  Abdomen:     Soft, non-tender, bowel sounds active all four quadrants,    no masses, no organomegaly  Genitalia:    Normal male without lesion, discharge or tenderness  Rectal:    Normal tone, normal prostate, no masses or tenderness;   guaiac negative stool  Extremities:   Extremities normal, atraumatic, no cyanosis or edema  Pulses:   2+ and symmetric all extremities  Skin:   Skin color, texture, turgor normal, no rashes or lesions  Lymph nodes:   Cervical, supraclavicular, and axillary nodes normal  Neurologic:   CNII-XII intact. Normal strength, sensation and reflexes      throughout     Ventilator settings:  none  Labs and Imaging:  Reviewed.   No results found.  Lab 09/15/11 0112 09/14/11 2223  NA 141 136  K 3.6 4.4  CL 112 108  CO2 21 20  BUN 16 18  CREATININE 0.58 0.75  GLUCOSE 104* 128*    Lab 09/15/11 0846 09/15/11 0500 09/15/11 0112  HGB 11.1* 11.3* 11.6*  HCT 32.6* 33.0* 33.3*  WBC 7.7 8.1 9.3  PLT 161 148* 152   No results found for this basename: INR, PROTIME   Assessment and Plan: GI bleed from recent polypectomy post re clipping 1/18. HD  stable. Hemostasis achieved by 1/19. PCCM to follow while in ICU -monitor serial cbc's -post clipping per GI - serial cbc's -tx to floor when stable next 24-48h; remain high risk bleed  Best practices / Disposition: -->ICU status under GI primarhy.Marland Kitchen PCCM consultiong. Keep in ICU next 24-48h due to high risk for bleeding -->full code -->scd -->Protonix for GI Px -->diet per GI svc   Dr. Kalman Shan, M.D., [facility].C.P Pulmonary and Critical  Care Medicine Staff Physician [facility] Pager: [phone], If no answer or between  15:00h - 7:00h: call [phone]  09/15/2011 10:58 AM

## 2011-09-15 NOTE — Progress Notes (Signed)
Patient ID: Dwayne Morris, male   DOB: [birth year], 78 y.o.   MRN: [id number] Subjective: No acute events overnight.  He is well at this time.  No further rectal bleeding.  Objective: Vital signs in last 24 hours: Temp:  [96.3 F (35.7 C)-98 F (36.7 C)] 97.9 F (36.6 C) (01/19 0400) Pulse Rate:  [56-76] 71  (01/19 0700) Resp:  [8-21] 19  (01/19 0700) BP: (38-146)/(27-67) 104/51 mmHg (01/19 0700) SpO2:  [89 %-100 %] 94 % (01/19 0700) Weight:  [64 kg (141 lb 1.5 oz)] 64 kg (141 lb 1.5 oz) (01/18 2010) Last BM Date: 09/14/11  Intake/Output from previous day: 01/18 0701 - 01/19 0700 In: 5950 [I.V.:4550; Blood:1400] Out: 1900 [Urine:1900] Intake/Output this shift:    General appearance: alert and no distress Resp: clear to auscultation bilaterally Cardio: regular rate and rhythm, S1, S2 normal, no murmur, click, rub or gallop GI: soft, non-tender; bowel sounds normal; no masses,  no organomegaly Extremities: extremities normal, atraumatic, no cyanosis or edema  Lab Results:  Basename 09/15/11 0500 09/15/11 0112 09/14/11 2330  WBC 8.1 9.3 9.4  HGB 11.3* 11.6* 12.7*  HCT 33.0* 33.3* 36.6*  PLT 148* 152 164   BMET  Basename 09/15/11 0112 09/14/11 2223  NA 141 136  K 3.6 4.4  CL 112 108  CO2 21 20  GLUCOSE 104* 128*  BUN 16 18  CREATININE 0.58 0.75  CALCIUM 7.2* 7.0*   LFT No results found for this basename: PROT,ALBUMIN,AST,ALT,ALKPHOS,BILITOT,BILIDIR,IBILI in the last 72 hours PT/INR No results found for this basename: LABPROT:2,INR:2 in the last 72 hours Hepatitis Panel No results found for this basename: HEPBSAG,HCVAB,HEPAIGM,HEPBIGM in the last 72 hours C-Diff No results found for this basename: CDIFFTOX:3 in the last 72 hours Fecal Lactopherrin No results found for this basename: FECLLACTOFRN in the last 72 hours  Studies/Results: No results found.  Medications:  Scheduled:   . antiseptic oral rinse  15 mL Mouth Rinse q12n4p  . chlorhexidine  15 mL Mouth  Rinse BID  . DISCONTD: polyethylene glycol-electrolytes  4,000 mL Oral Once   Continuous:   . sodium chloride 150 mL/hr at 09/15/11 0500  . DISCONTD: sodium chloride 150 mL/hr at 09/14/11 2130    Assessment/Plan: 1) Cecal post polypectomy bleed s/p hemoclipping. 2) HTN. 3) BPH. 4) History of prostate cancer. 5) GERD.   He is hemodynamically stable.  His HGB has trended downwards, but I cannot tell if he is still in the process of equilibration.  Clinically there is no evidence of any further bleeding, but the next 24 to 36 hours will determine if the acute issue has passed.    Plan: 1) Change CBC down to q8 hours. 2) Maintain ICU care for the next 24 hours. 3) Continue to hold anti-hypertensives (labetolol 200 mg TID). 4) I will restart Avodart for his urinary issues.  LOS: 1 day   Dwayne Morris D 09/15/2011, 8:22 AM

## 2011-09-16 DIAGNOSIS — K922 Gastrointestinal hemorrhage, unspecified: Secondary | ICD-10-CM

## 2011-09-16 LAB — CBC
HCT: 31.7 % — ABNORMAL LOW (ref 39.0–52.0)
HCT: 33.2 % — ABNORMAL LOW (ref 39.0–52.0)
HCT: 33.7 % — ABNORMAL LOW (ref 39.0–52.0)
Hemoglobin: 10.9 g/dL — ABNORMAL LOW (ref 13.0–17.0)
Hemoglobin: 11.5 g/dL — ABNORMAL LOW (ref 13.0–17.0)
Hemoglobin: 11.5 g/dL — ABNORMAL LOW (ref 13.0–17.0)
MCH: 31.2 pg (ref 26.0–34.0)
MCH: 31.5 pg (ref 26.0–34.0)
MCHC: 34.4 g/dL (ref 30.0–36.0)
MCHC: 34.6 g/dL (ref 30.0–36.0)
MCV: 90.8 fL (ref 78.0–100.0)
MCV: 92.3 fL (ref 78.0–100.0)
Platelets: 165 10*3/uL (ref 150–400)
RBC: 3.65 MIL/uL — ABNORMAL LOW (ref 4.22–5.81)
RDW: 14.1 % (ref 11.5–15.5)

## 2011-09-16 LAB — BASIC METABOLIC PANEL
Chloride: 110 mEq/L (ref 96–112)
GFR calc Af Amer: >90 mL/min (ref >90)
Potassium: 3.4 mEq/L — ABNORMAL LOW (ref 3.5–5.1)

## 2011-09-16 MED ORDER — POTASSIUM CHLORIDE 20 MEQ/15ML (10%) PO LIQD
40.0000 meq | Freq: Once | ORAL | Status: AC
  Administered 2011-09-16: 40 meq via ORAL
  Filled 2011-09-16: qty 30

## 2011-09-16 MED ORDER — PANTOPRAZOLE SODIUM 40 MG PO TBEC
40.0000 mg | DELAYED_RELEASE_TABLET | Freq: Every day | ORAL | Status: DC
  Administered 2011-09-16 – 2011-09-19 (×4): 40 mg via ORAL
  Filled 2011-09-16 (×5): qty 1

## 2011-09-16 NOTE — Progress Notes (Signed)
Patient ID: Dwayne Morris, male   DOB: 1927-02-13, 78 y.o.   MRN: [id number] Subjective: One small maroon bowel movement last evening.  No other complaints.  Objective: Vital signs in last 24 hours: Temp:  [97.9 F (36.6 C)-98.7 F (37.1 C)] 97.9 F (36.6 C) (01/20 0400) Pulse Rate:  [59-78] 65  (01/20 0800) Resp:  [9-23] 23  (01/20 0800) BP: (93-124)/(45-66) 117/57 mmHg (01/20 0800) SpO2:  [94 %-100 %] 97 % (01/20 0800) Last BM Date: 09/15/11  Intake/Output from previous day: 01/19 0701 - 01/20 0700 In: 3470 [P.O.:1220; I.V.:2250] Out: 4076 [Urine:4075; Stool:1] Intake/Output this shift:    General appearance: alert and no distress Resp: clear to auscultation bilaterally Cardio: regular rate and rhythm, S1, S2 normal, no murmur, click, rub or gallop GI: soft, non-tender; bowel sounds normal; no masses,  no organomegaly Extremities: extremities normal, atraumatic, no cyanosis or edema  Lab Results:  Basename 09/16/11 0021 09/15/11 1617 09/15/11 0846  WBC 6.1 6.4 7.7  HGB 10.9* 11.7* 11.1*  HCT 31.7* 33.5* 32.6*  PLT 152 159 161   BMET  Basename 09/16/11 0021 09/15/11 0112 09/14/11 2223  NA 140 141 136  K 3.4* 3.6 4.4  CL 110 112 108  CO2 24 21 20   GLUCOSE 92 104* 128*  BUN 9 16 18   CREATININE 0.69 0.58 0.75  CALCIUM 7.8* 7.2* 7.0*   LFT No results found for this basename: PROT,ALBUMIN,AST,ALT,ALKPHOS,BILITOT,BILIDIR,IBILI in the last 72 hours PT/INR No results found for this basename: LABPROT:2,INR:2 in the last 72 hours Hepatitis Panel No results found for this basename: HEPBSAG,HCVAB,HEPAIGM,HEPBIGM in the last 72 hours C-Diff No results found for this basename: CDIFFTOX:3 in the last 72 hours Fecal Lactopherrin No results found for this basename: FECLLACTOFRN in the last 72 hours  Studies/Results: No results found.  Medications:  Scheduled:   . dutasteride  0.5 mg Oral Daily  . pantoprazole  40 mg Oral Q1200  . potassium chloride  40 mEq Oral Once    . DISCONTD: antiseptic oral rinse  15 mL Mouth Rinse q12n4p  . DISCONTD: chlorhexidine  15 mL Mouth Rinse BID   Continuous:   . sodium chloride 75 mL/hr at 09/15/11 2238    Assessment/Plan: 1) Cecal post polypectomy bleed s/p hemoclipping.  2) HTN.  3) BPH.  4) History of prostate cancer.  5) GERD.     He continues to do well at this time.  He had a small maroon bowel movement last evening before his 12 AM CBC.  He may have some intermittent bleeding from the cecal site.  I do not have the AM labs at this time, but I do not feel he requires further ICU care.  I will transfer him to stepdown and continue to follow his CBC.  He may have some bleeding, but with conservative measures, his body may heal up and not required further intervention.  If there is still a suspicion of bleeding, I will consider ordering a bleeding scan to confirm any bleeding from the cecal site.  If it is positive he may required IR to embolize the site.   Plan:  1) Change CBC down to q12 hours.  2) Transfer to [facility].  3) Continue to hold anti-hypertensives (labetolol 200 mg TID).  He is not hypertensive at this time.    LOS: 2 days   Dwayne Morris 09/16/2011, 8:28 AM

## 2011-09-16 NOTE — Progress Notes (Signed)
eLink Physician-Brief Progress Note Patient Name: Dwayne Morris DOB: May 28, 1927 MRN: [id number]  Date of Service  09/16/2011   HPI/Events of Note  K 3.4   eICU Interventions  Replaced with 40 meq oral.     Intervention Category Minor Interventions: Electrolytes abnormality - evaluation and management  Sulay Brymer 09/16/2011, 1:52 AM

## 2011-09-16 NOTE — Progress Notes (Signed)
Name: Dwayne Morris MRN: [id number] DOB: [birth year]    LOS: 2  PCCM PROGRESS NOTE  History of Present Illness:  This is an 78 year old gentleman with PMH HTN, Type B aortic dissection in 2008,hemorrhagic stroke and prostate Ca. who is admitted to the hospital for a post polypectomy bleed. He underwent a colonoscopy 09/13/11 with findings of a 1.5-2 cm sessile cecal polyp. It was removed in a piecemeal fashion in two passes. No immediate bleeding occurred and prophylactically three hemoclips were placed across the lesion. It was technically difficult to place, but It was felt that the clips were successful in closing the lesion. He was well until 3 PM 09/14/11 when he complained of a bout of large hematochezia. Upon follow up at 5 PM on 09/14/11 the patient reported 3 other bloody bowel movements and mild dizziness with standing. At that time he was instructed by GI Dr Elnoria Howard to present to the hospital for admission. No complaints of chest pain or SOB. In ER he had bowel movement and had near syncope with hypotension. Emergently trnasfused and repeat colonoscopy done 09/14/11 by Dr Elnoria Howard and s/p hemoclipping. No dizziness in a recumbent position. Admiited to ICU and PCCM asked to follow 09/15/11. He is s/p 4 Units PRBC in ER 09/14/11   Lines / Drains: Peripheral IV  Cultures: MRSA Neg.  Antibiotics: None  Tests / Events: 1/18 GIB, 4 units PRBC transfused.  Vital Signs: Temp:  [97.9 F (36.6 C)-98.7 F (37.1 C)] 97.9 F (36.6 C) (01/20 0400) Pulse Rate:  [59-78] 74  (01/20 0900) Resp:  [9-23] 20  (01/20 0900) BP: (93-124)/(45-66) 111/56 mmHg (01/20 0900) SpO2:  [94 %-100 %] 96 % (01/20 0900) I/O last 3 completed shifts: In: 9420 [P.O.:1220; I.V.:6800; Blood:1400] Out: 5976 [Urine:5975; Stool:1]  Physical Examination: General:  NAD Neuro:  A+Ox3,  HEENT:  PERRL Neck:  Supple, No JVD Cardiovascular:  RRR, occasional PVCs noted on monitor, No M/G/R Lungs:  B/L CTA Abdomen:  Soft, non  tender, BS x 4 active Musculoskeletal:  No edema Skin:  No rash.  Ventilator settings:  none  Labs and Imaging:  Basic Metabolic Panel:  Basename 09/16/11 0021 09/15/11 0112  NA 140 141  K 3.4* 3.6  CL 110 112  CO2 24 21  GLUCOSE 92 104*  BUN 9 16  CREATININE 0.69 0.58  CALCIUM 7.8* 7.2*  MG -- --  PHOS -- --   CBC:  Basename 09/16/11 0818 09/16/11 0021  WBC 8.4 6.1  NEUTROABS -- --  HGB 11.5* 10.9*  HCT 33.2* 31.7*  MCV 91.0 90.8  PLT 169 152   CBG:  Basename 09/15/11 1212 09/15/11 0816  GLUCAP 91 81     Assessment and Plan: # Neurology    A+Ox3, NAD. Not on sedation.  # Gastroenterology system   1. GI bleed       from recent polypectomy post re clipping 1/18. HD stable. Hemostasis achieved by 1/19. PCCM to follow while in ICU.      Last small melena noted last night. CBC stable.  Lab 09/16/11 0818 09/16/11 0021 09/15/11 1617  HGB 11.5* 10.9* 11.7*  HCT 33.2* 31.7* 33.5*  WBC 8.4 6.1 6.4  PLT 169 152 159     -monitor serial cbc's  -post clipping per GI  -tx to floor today.   # Cardiology   1. HTN,         Plan         -hold home dose of antihypertensive  therapy due to acute GIB.         - will resume once stable  # BPH with Hx of Prostate cancer.    - continue home Avodart.  Best practices / Disposition: -->ICU status under GI primary. PCCM consulting.  -->full code -->SCD for DVT Px -->Protonix for GI Px -->RA -->diet: CL and per GI -->family updated at bedside    [facility] 09/16/2011, 10:13 AM   STAFF NOTE: I, Dr Lavinia Sharps have personally reviewed patient's available data, including medical history, events of note, physical examination and test results as part of my evaluation. I have discussed with residentand other care providers such as pharmacist, RN and RRT.  In addition,  I personally evaluated patient and elicited key findings of rectal bleeding. Has maintained normal vitals signs overngith. Some maroon stools but  hgb stable as of several hours ago and again this AM. D/w Dr Elnoria Howard of GI; he can move to regular medical bed and Dr Elnoria Howard will follow with serial H and H. PCCM will sign off. Rest per medical resident whose note is outlined above and that I agree with    Dr. Kalman Shan, M.D., [facility].C.P Pulmonary and Critical Care Medicine Staff Physician  System [facility] Pager: [phone], If no answer or between  15:00h - 7:00h: call [phone]  09/16/2011 10:13 AM

## 2011-09-17 ENCOUNTER — Encounter (HOSPITAL_COMMUNITY): Payer: Self-pay | Admitting: Gastroenterology

## 2011-09-17 LAB — CBC
MCH: 31.7 pg (ref 26.0–34.0)
MCHC: 34.4 g/dL (ref 30.0–36.0)
MCV: 92 fL (ref 78.0–100.0)
Platelets: 157 10*3/uL (ref 150–400)
Platelets: 174 10*3/uL (ref 150–400)
RBC: 3.43 MIL/uL — ABNORMAL LOW (ref 4.22–5.81)
RDW: 13.9 % (ref 11.5–15.5)
RDW: 13.9 % (ref 11.5–15.5)
WBC: 6.5 10*3/uL (ref 4.0–10.5)

## 2011-09-17 LAB — BASIC METABOLIC PANEL
BUN: 11 mg/dL (ref 6–23)
CO2: 25 mEq/L (ref 19–32)
Calcium: 8 mg/dL — ABNORMAL LOW (ref 8.4–10.5)
Creatinine, Ser: 0.8 mg/dL (ref 0.50–1.35)
Glucose, Bld: 131 mg/dL — ABNORMAL HIGH (ref 70–99)

## 2011-09-17 NOTE — Progress Notes (Signed)
Patient ID: Dwayne Morris, male   DOB: 07-Aug-1927, 78 y.o.   MRN: [id number] Subjective: No acute events.  One melenic bowel movement yesterday afternoon.  Objective: Vital signs in last 24 hours: Temp:  [97.8 F (36.6 C)-98 F (36.7 C)] 98 F (36.7 C) (01/21 0519) Pulse Rate:  [63-81] 63  (01/21 0519) Resp:  [14-20] 18  (01/21 0519) BP: (91-112)/(47-57) 112/57 mmHg (01/21 0519) SpO2:  [94 %-98 %] 96 % (01/21 0519) Last BM Date: 09/16/11  Intake/Output from previous day: 01/20 0701 - 01/21 0700 In: 1140 [P.O.:240; I.V.:900] Out: 1650 [Urine:1650] Intake/Output this shift:    General appearance: alert and no distress Resp: clear to auscultation bilaterally Cardio: regular rate and rhythm, S1, S2 normal, no murmur, click, rub or gallop GI: soft, non-tender; bowel sounds normal; no masses,  no organomegaly Extremities: extremities normal, atraumatic, no cyanosis or edema  Lab Results:  Basename 09/17/11 0011 09/16/11 1540 09/16/11 0818  WBC 5.6 6.6 8.4  HGB 10.7* 11.5* 11.5*  HCT 31.1* 33.7* 33.2*  PLT 157 165 169   BMET  Basename 09/17/11 0011 09/16/11 0021 09/15/11 0112  NA 141 140 141  K 3.6 3.4* 3.6  CL 111 110 112  CO2 25 24 21   GLUCOSE 131* 92 104*  BUN 11 9 16   CREATININE 0.80 0.69 0.58  CALCIUM 8.0* 7.8* 7.2*   LFT No results found for this basename: PROT,ALBUMIN,AST,ALT,ALKPHOS,BILITOT,BILIDIR,IBILI in the last 72 hours PT/INR No results found for this basename: LABPROT:2,INR:2 in the last 72 hours Hepatitis Panel No results found for this basename: HEPBSAG,HCVAB,HEPAIGM,HEPBIGM in the last 72 hours C-Diff No results found for this basename: CDIFFTOX:3 in the last 72 hours Fecal Lactopherrin No results found for this basename: FECLLACTOFRN in the last 72 hours  Studies/Results: No results found.  Medications:  Scheduled:   . dutasteride  0.5 mg Oral Daily  . pantoprazole  40 mg Oral Q1200   Continuous:   . sodium chloride 75 mL/hr at 09/16/11  1253    Assessment/Plan: 1) Cecal post-polypectomy bleed. 2) HTN. 3) BPH.   His HGB has fluctuated, but with the report of melena yesterday I have a suspicion that he may have a very slow GI bleed.  Overall he his hemodynamically stable and tolerating PO without any difficulty.    Plan: 1) Continue hospitalization.There is a risk of a postpolypectomy ulceration opening up beyond the scope of the hemoclips that can result in a significant rebleed. 2) Continue to follow HGB. 3) No issues with HTN at this time and his antihypertensives will continue to be held. 4) Continue with Protonix for his history of GERD.  LOS: 3 days   Ranita Stjulien D 09/17/2011, 8:48 AM

## 2011-09-17 NOTE — Op Note (Signed)
[facility] [address] [place], [place]  [address]  OPERATIVE PROCEDURE REPORT  PATIENT:  Dwayne Morris, Dwayne Morris  MR#:  [id number] BIRTHDATE:  Jan 17, 1927  GENDER:  male ENDOSCOPIST:  Jeani Hawking, MD PROCEDURE DATE:  09/14/2011 PROCEDURE:  Colon w/ endoscopic clipping ASA CLASS:  Class III INDICATIONS:  Post polypectomy bleeding MEDICATIONS:  Fentanyl 50 mcg IV, Versed 2 mg IV  DESCRIPTION OF PROCEDURE:   After the risks benefits and alternatives of the procedure were thoroughly explained, informed consent was obtained.  Digital rectal exam was performed and revealed no abnormalities.   The  endoscope was introduced through the anus and advanced to the cecum, which was identified by both the appendix and ileocecal valve, without limitations.  The quality of the prep was adequate..  The instrument was then slowly withdrawn as the colon was fully examined. <<PROCEDUREIMAGES>>  FINDINGS:  The colonoscopy was very difficult to perform. Minimal sedation was provided as his blood pressure was tenuous and it was difficult for the patient to tolerate the procedure. he was placed in the supine, right lateral, and prone positions, but they did not allow cecal intubation. With targeted external pressure in the left lateral decubitus position the cecum was intubated and excellent positioning was obtained. A visible vessel with arterial spurting was identified. After 9 hemoclip placements bleeding persisted, but with the 10th hemoclip the bleeding was arrested. An 11th hemoclip was placed for good measure to close up the polypectomy site. No further bleeding was observed with prolonged observation of the site. The patient's blood pressure stabilized between the 130's-140's. His HR remained in the 60's throughout the entire procedure. At this point the procedure was terminated. Retroflexion was not performed.  The scope was then withdrawn from the patient and the procedure  terminated.  COMPLICATIONS:  None  IMPRESSION:  1) Cecal post-polypectomy bleeding. RECOMMENDATIONS:  1) Follow HGB. 2) Transfuse as necessary. 3) Hold antihypertensives for now.  ______________________________ Jeani Hawking, MD  n. Rosalie DoctorJeani Hawking at 09/14/2011 10:28 PM  Birdie Hopes, [phone]

## 2011-09-18 LAB — BASIC METABOLIC PANEL
CO2: 26 mEq/L (ref 19–32)
Calcium: 8.4 mg/dL (ref 8.4–10.5)
Chloride: 108 mEq/L (ref 96–112)
GFR calc Af Amer: >90 mL/min (ref >90)
Sodium: 141 mEq/L (ref 135–145)

## 2011-09-18 LAB — CBC
MCHC: 34.2 g/dL (ref 30.0–36.0)
RDW: 13.7 % (ref 11.5–15.5)

## 2011-09-18 MED ORDER — PNEUMOCOCCAL VAC POLYVALENT 25 MCG/0.5ML IJ INJ
0.5000 mL | INJECTION | INTRAMUSCULAR | Status: AC
  Administered 2011-09-19: 0.5 mL via INTRAMUSCULAR
  Filled 2011-09-18: qty 0.5

## 2011-09-18 NOTE — Progress Notes (Signed)
Patient ID: Dwayne Morris, male   DOB: 1927/04/19, 78 y.o.   MRN: [id number] Subjective: The patient is well.  No complaints.  He had a bowel movement yesterday and it was brown in color.  No evidence of bleeding.  Objective: Vital signs in last 24 hours: Temp:  [97.7 F (36.5 C)-98.4 F (36.9 C)] 97.8 F (36.6 C) (01/22 0555) Pulse Rate:  [68-74] 68  (01/22 0555) Resp:  [16-18] 16  (01/22 0555) BP: (107-124)/(41-69) 124/60 mmHg (01/22 0555) SpO2:  [98 %-100 %] 98 % (01/22 0555) Last BM Date: 09/18/11  Intake/Output from previous day: 01/21 0701 - 01/22 0700 In: 960 [P.O.:960] Out: -  Intake/Output this shift:    General appearance: alert and no distress Resp: clear to auscultation bilaterally Cardio: regular rate and rhythm, S1, S2 normal, no murmur, click, rub or gallop GI: soft, non-tender; bowel sounds normal; no masses,  no organomegaly Extremities: extremities normal, atraumatic, no cyanosis or edema  Lab Results:  Basename 09/18/11 0618 09/17/11 1111 09/17/11 0011  WBC 5.7 6.5 5.6  HGB 10.5* 10.8* 10.7*  HCT 30.7* 31.4* 31.1*  PLT 179 174 157   BMET  Basename 09/17/11 0011 09/16/11 0021  NA 141 140  K 3.6 3.4*  CL 111 110  CO2 25 24  GLUCOSE 131* 92  BUN 11 9  CREATININE 0.80 0.69  CALCIUM 8.0* 7.8*   LFT No results found for this basename: PROT,ALBUMIN,AST,ALT,ALKPHOS,BILITOT,BILIDIR,IBILI in the last 72 hours PT/INR No results found for this basename: LABPROT:2,INR:2 in the last 72 hours Hepatitis Panel No results found for this basename: HEPBSAG,HCVAB,HEPAIGM,HEPBIGM in the last 72 hours C-Diff No results found for this basename: CDIFFTOX:3 in the last 72 hours Fecal Lactopherrin No results found for this basename: FECLLACTOFRN in the last 72 hours  Studies/Results: No results found.  Medications:  Scheduled:   . dutasteride  0.5 mg Oral Daily  . pantoprazole  40 mg Oral Q1200  . pneumococcal 23 valent vaccine  0.5 mL Intramuscular  Tomorrow-1000   Continuous:   . DISCONTD: sodium chloride 75 mL/hr at 09/18/11 0326    Assessment/Plan: 1) Cecal post-polypectomy bleed.  2) HTN.  3) BPH.    His HGB is mildly decreased, but I do not know if this is a fluctuation, equilibration, or representative of ongoing bleeding.  Again, no further blood in his stools yesterday, but the bleeding can be intermittent.  Plan:  1) Continue hospitalization as his HGB is mildly decreased. It is encouraging to see that his stools are brown, however, with the severity of his bleed and his age it will be prudent to monitor for another 24 hours.  If his HGB is relatively stable tomorrow and no further blood in his stool, he can be discharged home. 2) Continue to follow HGB.  3) No issues with HTN at this time and his antihypertensives will continue to be held.  4) Continue with Protonix. 5) Pneumovax per the patient's request. 6) D/C IV and saline lock.   LOS: 4 days   Dwayne Morris D 09/18/2011, 7:39 AM

## 2011-09-19 LAB — CBC
Hemoglobin: 11.1 g/dL — ABNORMAL LOW (ref 13.0–17.0)
MCH: 32 pg (ref 26.0–34.0)
MCHC: 35.1 g/dL (ref 30.0–36.0)
RDW: 13.6 % (ref 11.5–15.5)
WBC: 7.1 10*3/uL (ref 4.0–10.5)

## 2011-09-19 LAB — BASIC METABOLIC PANEL
BUN: 13 mg/dL (ref 6–23)
Calcium: 8.9 mg/dL (ref 8.4–10.5)
GFR calc non Af Amer: 82 mL/min — ABNORMAL LOW (ref >90)
Glucose, Bld: 93 mg/dL (ref 70–99)
Sodium: 139 mEq/L (ref 135–145)

## 2011-09-19 NOTE — Progress Notes (Signed)
D/c home with wife.  Pt/wife verbalized understanding of d/c instructions

## 2011-09-19 NOTE — Discharge Summary (Signed)
Physician Discharge Summary  Patient ID: Dwayne Morris MRN: [id number] DOB/AGE: 1926/10/12 78 y.o.  Admit date: 09/14/2011 Discharge date: 09/19/2011  Admission Diagnoses: Cecal postpolypectomy bleed.  Discharge Diagnoses: Cecal postpolypectomy bleed. Active Problems:  * No active hospital problems. *    Discharged Condition: good  Hospital Course: The patient underwent a routine colonoscopy on 09/13/2011 without any immediate complications.  He was identified to have a large sessile cecal polyp that was removed and then prophylaxed with three hemoclips.  Despite the hemoclips the patient started to have bleeding on 09/14/2011.  At 3 PM he had a bout of hematochezia and then a phone call 1.5 hours later was significant for three more bloody bowel movements.  He was instructed to present to the ER.  While in the ER he reported feeling a little dizzy with standing, but his vital signs were stable.  The patient had another bowel movement in the ER and at that time he had a near syncopal episode.  At that time the decision was made to upgrade him to ICU status and to perform an emergent colonoscopy.  His HGB was not able to be drawn immediately as aggressive fluid resuscitation was performed and two units of O neg PRBC was ordered.  It was only after his first unit of blood that his CBC was drawn and it was noted to be in the 10 range.  The colonoscopy was performed and it was markedly difficult.  His blood pressure was tenuous and minimal sedation was provided.  After a very difficult procedure the bleeding from the cecum was arrested with a total of 11 hemoclips.  During the procedure he received an additional 3 units of O negative blood and his blood pressure improved.  Also, IR was notified and they were on standby if the colonoscopy failed to arrest the bleeding.  Over the intervening days he continued to improve.  He cleared the blood from his colon, but it was not complete certain if he truly stopped  bleeding.  He did have a couple of melenic/maroon stools, albeit small, but his HGB fluctuated.  Over the past 48 hours his HGB stabilized and even improved upon D/C and he had two brown stools.  He was tolerating PO without any difficulty.  His HTN was never an issue and he was never restarted on his labetolol.  In fact, upon D/C I will have him continue to hold his labetolol.  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  No evidence of any malignancy.  Consults: pulmonary/intensive care  Significant Diagnostic Studies: None  Treatments: IV hydration and Blood transfusions.  Discharge Exam: Blood pressure 115/66, pulse 72, temperature 98.2 F (36.8 C), temperature source Oral, resp. rate 18, height 5\' 3"  (1.6 m), weight 64 kg (141 lb 1.5 oz), SpO2 97.00%. General appearance: alert and no distress Resp: clear to auscultation bilaterally GI: soft, non-tender; bowel sounds normal; no masses,  no organomegaly Extremities: extremities normal, atraumatic, no cyanosis or edema  Disposition:   Discharge Orders    Future Orders Please Complete By Expires   Discharge instructions        Medication List  As of 09/19/2011  8:58 AM   STOP taking these medications         labetalol 100 MG tablet         TAKE these medications         aspirin 81 MG tablet   Take 81 mg by mouth daily.  atorvastatin 20 MG tablet   Commonly known as: LIPITOR   Take 20 mg by mouth at bedtime.      CALCIUM + D PO   Take 1 tablet by mouth daily.      Choline Fenofibrate 135 MG capsule   Take 135 mg by mouth daily.      dutasteride 0.5 MG capsule   Commonly known as: AVODART   Take 0.5 mg by mouth daily.      esomeprazole 40 MG capsule   Commonly known as: NEXIUM   Take 40 mg by mouth daily before breakfast.      Folic Acid-Vit B6-Vit B12 2.5-25-1 MG Tabs   Commonly known as: FOLBEE   Take 1 tablet by mouth daily.      mulitivitamin with minerals Tabs    Take 1 tablet by mouth daily.      VITAMIN C PO   Take 1 tablet by mouth daily.      VITAMIN E PO   Take 1 tablet by mouth daily.           Follow-up Information    Follow up with Dwayne Grippe, MD .         SignedTheda Morris 09/19/2011, 8:58 AM    Physician Discharge Summary  Patient ID: Dwayne Morris MRN: [id number] DOB/AGE: March 29, 1927 79 y.o.  Admit date: 09/14/2011 Discharge date: 09/19/2011  Admission Diagnoses:  Discharge Diagnoses:  Active Problems:  * No active hospital problems. *    Discharged Condition: good  Hospital Course: The patient underwent a routine colonoscopy on 09/13/2011 without any immediate complications.  He was identified to have a large sessile cecal polyp that was removed and then prophylaxed with three hemoclips.  Despite the hemoclips the patient started to have bleeding on 09/14/2011.  At 3 PM he had a bout of hematochezia and then a phone call 1.5 hours later was significant for three more bloody bowel movements.  He was instructed to present to the ER.  While in the ER he reported feeling a little dizzy with standing, but his vital signs were stable.  The patient had another bowel movement in the ER and at that time he had a near syncopal episode.  At that time the decision was made to upgrade him to ICU status and to perform an emergent colonoscopy.  His HGB was not able to be drawn immediately as aggressive fluid resuscitation was performed and two units of O neg PRBC was ordered.  It was only after his first unit of blood that his CBC was drawn and it was noted to be in the 10 range.  The colonoscopy was performed and it was markedly difficult.  His blood pressure was tenuous and minimal sedation was provided.  After a very difficult procedure the bleeding from the cecum was arrested with a total of 11 hemoclips.  During the procedure he received an additional 3 units of O negative blood and his blood pressure improved.  Also, IR was notified  and they were on standby if the colonoscopy failed to arrest the bleeding.  Over the intervening days he continued to improve.  He cleared the blood from his colon, but it was not complete certain if he truly stopped bleeding.  He did have a couple of melenic/maroon stools, albeit small, but his HGB fluctuated.  Over the past 48 hours his HGB stabilized and even improved upon D/C and he had two brown stools.  He was tolerating PO without any  difficulty.  His HTN was never an issue and he was never restarted on his labetolol.  In fact, upon D/C I will have him continue to hold his labetolol.  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  No evidence of any malignancy.   Consults: pulmonary/intensive care  Significant Diagnostic Studies: See above  Treatments: See above.  Discharge Exam: Blood pressure 115/66, pulse 72, temperature 98.2 F (36.8 C), temperature source Oral, resp. rate 18, height 5\' 3"  (1.6 m), weight 64 kg (141 lb 1.5 oz), SpO2 97.00%. See above.  Disposition: Home in good condition.  Follow up with Dr. Elnoria Howard in 1 week.  Discharge Orders    Future Orders Please Complete By Expires   Discharge instructions        Medication List  As of 09/19/2011  8:58 AM   STOP taking these medications         labetalol 100 MG tablet         TAKE these medications         aspirin 81 MG tablet   Take 81 mg by mouth daily.      atorvastatin 20 MG tablet   Commonly known as: LIPITOR   Take 20 mg by mouth at bedtime.      CALCIUM + D PO   Take 1 tablet by mouth daily.      Choline Fenofibrate 135 MG capsule   Take 135 mg by mouth daily.      dutasteride 0.5 MG capsule   Commonly known as: AVODART   Take 0.5 mg by mouth daily.      esomeprazole 40 MG capsule   Commonly known as: NEXIUM   Take 40 mg by mouth daily before breakfast.      Folic Acid-Vit B6-Vit B12 2.5-25-1 MG Tabs   Commonly known as: FOLBEE   Take 1 tablet by mouth daily.        mulitivitamin with minerals Tabs   Take 1 tablet by mouth daily.      VITAMIN C PO   Take 1 tablet by mouth daily.      VITAMIN E PO   Take 1 tablet by mouth daily.           Follow-up Information    Follow up with Dwayne Grippe, MD .         SignedJeani Hawking D 09/19/2011, 8:58 AM

## 2011-12-04 ENCOUNTER — Encounter: Payer: Self-pay | Admitting: Cardiovascular Disease

## 2011-12-04 ENCOUNTER — Ambulatory Visit (INDEPENDENT_AMBULATORY_CARE_PROVIDER_SITE_OTHER): Payer: Medicare Other | Admitting: Cardiovascular Disease

## 2011-12-04 VITALS — BP 122/68 | HR 68 | Ht 62.5 in | Wt 142.4 lb

## 2011-12-04 DIAGNOSIS — Z125 Encounter for screening for malignant neoplasm of prostate: Secondary | ICD-10-CM

## 2011-12-04 DIAGNOSIS — I7101 Dissection of thoracic aorta: Secondary | ICD-10-CM

## 2011-12-04 DIAGNOSIS — C61 Malignant neoplasm of prostate: Secondary | ICD-10-CM

## 2011-12-04 DIAGNOSIS — E785 Hyperlipidemia, unspecified: Secondary | ICD-10-CM

## 2011-12-04 DIAGNOSIS — I7100 Dissection of unspecified site of aorta: Secondary | ICD-10-CM

## 2011-12-04 LAB — BASIC METABOLIC PANEL
BUN: 24 mg/dL — ABNORMAL HIGH (ref 6–23)
Creatinine, Ser: 1 mg/dL (ref 0.4–1.5)
GFR: 77.61 mL/min (ref >60.00)

## 2011-12-04 LAB — HEPATIC FUNCTION PANEL
ALT: 19 U/L (ref 0–53)
AST: 21 U/L (ref 0–37)
Alkaline Phosphatase: 47 U/L (ref 39–117)
Bilirubin, Direct: 0 mg/dL (ref 0.0–0.3)
Total Protein: 6.5 g/dL (ref 6.0–8.3)

## 2011-12-04 LAB — LIPID PANEL: Cholesterol: 145 mg/dL (ref 0–200)

## 2011-12-04 NOTE — Assessment & Plan Note (Signed)
DK is doing well.  His blood pressures have been very well controlled. We'll continue the same medications.

## 2011-12-04 NOTE — Progress Notes (Signed)
Dwayne Morris Date of Birth  Feb 17, 1927 [facility]     [place]  [address]    [address]   [address] [place], [place]  [address]    [place], [place]  [address] [phone]  Fax  [phone]  [phone]  Fax [phone]  Problem list: 1. Type III aortic dissection 2. Hypertension 3. Prostate cancer  History of Present Illness:   Dwayne Morris is an 78 year old gentleman originally from [place]. He is done quite well since I last saw him 6 months ago. [name] not had any problems with his blood pressure. [name] been taking his medications the records his blood pressure on a regular basis.  He has a history of an aortic dissection. [name] not had any further episodes of chest pain. We have followed him with CT scans and his most recent CT scans have not shown any worsening of his dissection.  Current Outpatient Prescriptions on File Prior to Visit  Medication Sig Dispense Refill  . Ascorbic Acid (VITAMIN C PO) Take 1 tablet by mouth daily.        Marland Kitchen aspirin 81 MG tablet Take 81 mg by mouth daily.        Marland Kitchen atorvastatin (LIPITOR) 20 MG tablet Take 20 mg by mouth at bedtime.      . Calcium Carbonate-Vitamin D (CALCIUM + D PO) Take 1 tablet by mouth daily.       . Choline Fenofibrate 135 MG capsule Take 135 mg by mouth daily.      Marland Kitchen dutasteride (AVODART) 0.5 MG capsule Take 0.5 mg by mouth daily.        Marland Kitchen esomeprazole (NEXIUM) 40 MG capsule Take 40 mg by mouth daily before breakfast.        . Folic Acid-Vit B6-Vit B12 (FOLBEE) 2.5-25-1 MG TABS Take 1 tablet by mouth daily.        . Multiple Vitamin (MULITIVITAMIN WITH MINERALS) TABS Take 1 tablet by mouth daily.      Marland Kitchen VITAMIN E PO Take 1 tablet by mouth daily.        Labetalol 100 mg PO BID   No Known Allergies  Past Medical History  Diagnosis Date  . Hypertension   . Aortic dissection     type B aortic dissection in November 2008  . Prostate cancer     PSA levels have decreased with garlic therapy  . Stroke 1996     History of right vertebral artery dissection with stroke    Past Surgical History  Procedure Date  . Colonoscopy 09/14/2011    Procedure: COLONOSCOPY;  Surgeon: Theda Belfast, MD;  Location: [facility];  Service: Endoscopy;  Laterality: N/A;    History  Smoking status  . Former Smoker -- 1.0 packs/day for 30 years  . Types: Cigarettes  . Quit date: 05/15/1974  Smokeless tobacco  . Not on file    History  Alcohol Use No    No family history on file.  Reviw of Systems:  Reviewed in the HPI.  All other systems are negative.  Physical Exam: Blood pressure 122/68, pulse 68, height 5' 2.5" (1.588 m), weight 142 lb 6.4 oz (64.592 kg). General: Well developed, well nourished, in no acute distress.  Head: Normocephalic, atraumatic, sclera non-icteric, mucus membranes are moist,   Neck: Supple. Carotids are 2 + without bruits. No JVD  Lungs: Clear bilaterally to auscultation.  Heart: regular rate.  normal  S1 S2. No murmurs, gallops or rubs.  Abdomen: Soft, non-tender, non-distended with normal bowel sounds. No hepatomegaly. No rebound/guarding. No masses.  Msk:  Strength and tone are normal  Extremities: No clubbing or cyanosis. No edema.  Distal pedal pulses are 2+ and equal bilaterally.  Neuro: Alert and oriented X 3. Moves all extremities spontaneously.  Psych:  Responds to questions appropriately with a normal affect.  ECG:  Assessment / Plan:

## 2011-12-04 NOTE — Patient Instructions (Signed)
Your physician wants you to follow-up in: 6 MONTHS  You will receive a reminder letter in the mail two months in advance. If you don't receive a letter, please call our office to schedule the follow-up appointment.  Your physician recommends that you return for a FASTING lipid profile: TODAY AND IN 6 MONTHS  

## 2011-12-04 NOTE — Assessment & Plan Note (Signed)
Dwayne Morris continues to use garlic suppositories ( pressed garlic with vaseline).  His PSA levels have fallen and his prostate cancer is fairly well controlled.

## 2011-12-17 ENCOUNTER — Telehealth: Payer: Self-pay | Admitting: Cardiovascular Disease

## 2011-12-17 MED ORDER — SILDENAFIL CITRATE 50 MG PO TABS: 50.0000 mg | ORAL_TABLET | Freq: Every day | ORAL | Status: DC | PRN

## 2011-12-17 MED ORDER — SILDENAFIL CITRATE 100 MG PO TABS: 100.0000 mg | ORAL_TABLET | Freq: Every day | ORAL | Status: DC | PRN

## 2011-12-17 NOTE — Telephone Encounter (Signed)
New msg Pt wants refill of viagra called [facility] [address]. He also wants to know test results. Please call him back

## 2011-12-17 NOTE — Telephone Encounter (Signed)
Pt request 50 mg

## 2011-12-17 NOTE — Telephone Encounter (Signed)
Viagra not on med list, please advise.

## 2011-12-17 NOTE — Telephone Encounter (Signed)
Please check the old paper chart.  He may have been on it.  2 options.  1.  He has a urologist - Bjorn Pippin.  Dr. Annabell Howells may have given it to him. I dont recall 2.  I'm OK with giving him Viagra 100 mg prn , # 10  Please add it med list

## 2011-12-19 ENCOUNTER — Other Ambulatory Visit: Payer: Self-pay | Admitting: *Deleted

## 2011-12-19 ENCOUNTER — Other Ambulatory Visit: Payer: Self-pay | Admitting: Cardiology

## 2011-12-19 MED ORDER — CHOLINE FENOFIBRATE 135 MG PO CPDR: 135.0000 mg | DELAYED_RELEASE_CAPSULE | Freq: Every day | ORAL | Status: DC

## 2011-12-19 NOTE — Telephone Encounter (Signed)
Opened in Error.

## 2011-12-24 ENCOUNTER — Telehealth: Payer: Self-pay | Admitting: Cardiovascular Disease

## 2011-12-24 ENCOUNTER — Other Ambulatory Visit: Payer: Self-pay | Admitting: *Deleted

## 2011-12-24 MED ORDER — CHOLINE FENOFIBRATE 135 MG PO CPDR: 135.0000 mg | DELAYED_RELEASE_CAPSULE | Freq: Every day | ORAL | Status: DC

## 2011-12-24 NOTE — Telephone Encounter (Signed)
Pt called to say [facility] [address] states they do not have the refill of choline fenofibrate 135mg  that shows was called in 4-24, pls call again pt out needs asap

## 2011-12-24 NOTE — Telephone Encounter (Signed)
Pharmacy was called and they stated the med was available as trilipix as of 4/24, pt made aware.

## 2012-01-25 ENCOUNTER — Other Ambulatory Visit: Payer: Self-pay | Admitting: *Deleted

## 2012-01-25 MED ORDER — LABETALOL HCL 100 MG PO TABS: 100.0000 mg | ORAL_TABLET | Freq: Two times a day (BID) | ORAL | Status: DC

## 2012-01-25 NOTE — Telephone Encounter (Signed)
Fax Received. Refill Completed. Limuel Nieblas Chowoe (R.M.A)   

## 2012-06-09 ENCOUNTER — Ambulatory Visit: Payer: Medicare Other | Admitting: Cardiovascular Disease

## 2012-06-16 ENCOUNTER — Encounter: Payer: Self-pay | Admitting: Cardiovascular Disease

## 2012-07-04 ENCOUNTER — Ambulatory Visit (INDEPENDENT_AMBULATORY_CARE_PROVIDER_SITE_OTHER): Payer: Medicare Other | Admitting: Cardiovascular Disease

## 2012-07-04 ENCOUNTER — Encounter: Payer: Self-pay | Admitting: Cardiovascular Disease

## 2012-07-04 VITALS — BP 108/60 | HR 98 | Ht 62.5 in | Wt 132.0 lb

## 2012-07-04 DIAGNOSIS — I10 Essential (primary) hypertension: Secondary | ICD-10-CM

## 2012-07-04 DIAGNOSIS — C61 Malignant neoplasm of prostate: Secondary | ICD-10-CM

## 2012-07-04 DIAGNOSIS — E785 Hyperlipidemia, unspecified: Secondary | ICD-10-CM

## 2012-07-04 DIAGNOSIS — I7101 Dissection of thoracic aorta: Secondary | ICD-10-CM

## 2012-07-04 DIAGNOSIS — I7100 Dissection of unspecified site of aorta: Secondary | ICD-10-CM

## 2012-07-04 LAB — CBC WITH DIFFERENTIAL/PLATELET
Basophils Absolute: 0 10*3/uL (ref 0.0–0.1)
Eosinophils Absolute: 0 10*3/uL (ref 0.0–0.7)
Lymphocytes Relative: 31.9 % (ref 12.0–46.0)
MCHC: 33.2 g/dL (ref 30.0–36.0)
Neutro Abs: 3 10*3/uL (ref 1.4–7.7)
Neutrophils Relative %: 57.5 % (ref 43.0–77.0)
RDW: 13.4 % (ref 11.5–14.6)

## 2012-07-04 LAB — BASIC METABOLIC PANEL
BUN: 23 mg/dL (ref 6–23)
Calcium: 9.3 mg/dL (ref 8.4–10.5)
GFR: 75.72 mL/min (ref >60.00)
Glucose, Bld: 87 mg/dL (ref 70–99)

## 2012-07-04 LAB — HEPATIC FUNCTION PANEL
Albumin: 4.2 g/dL (ref 3.5–5.2)
Alkaline Phosphatase: 43 U/L (ref 39–117)
Bilirubin, Direct: 0.2 mg/dL (ref 0.0–0.3)

## 2012-07-04 LAB — LIPID PANEL: VLDL: 7.4 mg/dL (ref 0.0–40.0)

## 2012-07-04 NOTE — Assessment & Plan Note (Signed)
DK is doing well. His blood pressure remains well controlled. He'll continue the same dose of labetalol.  We'll check labs on him during this visit. Check fasting lipid profile, Lipomed profile profile, PSA, CBC, hepatic profile, and BMP. He has a history of prostate cancer as well. He'll be seeing Dr. Annabell Howells soon

## 2012-07-04 NOTE — Progress Notes (Signed)
Dwayne Morris Date of Birth  20-Apr-1927 [facility]     [place] Office  [address]    [address]   [address] [place], [place]  [address]    [place], [place]  [address] [phone]  Fax  [phone]  [phone]  Fax [phone]  Problem list: 1. Type III aortic dissection 2. Hypertension 3. Prostate cancer  History of Present Illness:   DK is an 78 year old gentleman originally from [place]. He is done quite well since I last saw him 6 months ago. [name] not had any problems with his blood pressure. [name] been taking his medications the records his blood pressure on a regular basis.  He has a history of an aortic dissection. [name] not had any further episodes of chest pain. We have followed him with CT scans and his most recent CT scans have not shown any worsening of his dissection.    Current Outpatient Prescriptions on File Prior to Visit  Medication Sig Dispense Refill  . Ascorbic Acid (VITAMIN C PO) Take 1 tablet by mouth daily.        Marland Kitchen aspirin 81 MG tablet Take 81 mg by mouth daily.        Marland Kitchen atorvastatin (LIPITOR) 20 MG tablet Take 20 mg by mouth at bedtime.      . Calcium Carbonate-Vitamin D (CALCIUM + D PO) Take 1 tablet by mouth daily.       . Choline Fenofibrate 135 MG capsule Take 1 capsule (135 mg total) by mouth daily.  90 capsule  3  . dutasteride (AVODART) 0.5 MG capsule Take 0.5 mg by mouth daily.        Marland Kitchen esomeprazole (NEXIUM) 40 MG capsule Take 40 mg by mouth daily before breakfast.        . labetalol (NORMODYNE) 100 MG tablet Take 1 tablet (100 mg total) by mouth 2 (two) times daily.  60 tablet  5  . Multiple Vitamin (MULITIVITAMIN WITH MINERALS) TABS Take 1 tablet by mouth daily.      Marland Kitchen VITAMIN E PO Take 1 tablet by mouth daily.        Labetalol 100 mg PO BID   No Known Allergies  Past Medical History  Diagnosis Date  . Hypertension   . Aortic dissection     type B aortic dissection in November 2008  . Prostate cancer     PSA  levels have decreased with garlic therapy  . Stroke 1996    History of right vertebral artery dissection with stroke    Past Surgical History  Procedure Date  . Colonoscopy 09/14/2011    Procedure: COLONOSCOPY;  Surgeon: Theda Belfast, MD;  Location: [facility] ENDOSCOPY;  Service: Endoscopy;  Laterality: N/A;    History  Smoking status  . Former Smoker -- 1.0 packs/day for 30 years  . Types: Cigarettes  . Quit date: 05/15/1974  Smokeless tobacco  . Not on file    History  Alcohol Use No    No family history on file.  Reviw of Systems:  Reviewed in the HPI.  All other systems are negative.  Physical Exam: Blood pressure 108/60, pulse 98, height 5' 2.5" (1.588 m), weight 132 lb (59.875 kg). General: Well developed, well nourished, in no acute distress.  Head: Normocephalic, atraumatic, sclera non-icteric, mucus membranes are moist,   Neck: Supple. Carotids are 2 + without bruits. No JVD  Lungs: Clear bilaterally to auscultation.  Heart: regular rate.  normal  S1 S2.  No murmurs, gallops or rubs.  Abdomen: Soft, non-tender, non-distended with normal bowel sounds. No hepatomegaly. No rebound/guarding. No masses.  Msk:  Strength and tone are normal  Extremities: No clubbing or cyanosis. No edema.  Distal pedal pulses are 2+ and equal bilaterally.  Neuro: Alert and oriented X 3. Moves all extremities spontaneously.  Psych:  Responds to questions appropriately with a normal affect.  ECG:  Assessment / Plan:

## 2012-07-04 NOTE — Patient Instructions (Addendum)
Your physician recommends that you return for lab work in: TODAY AND IN 6 MONTHS   Your physician wants you to follow-up in: 6 MONTHS You will receive a reminder letter in the mail two months in advance. If you don't receive a letter, please call our office to schedule the follow-up appointment.

## 2012-07-09 ENCOUNTER — Telehealth: Payer: Self-pay | Admitting: *Deleted

## 2012-07-09 NOTE — Telephone Encounter (Signed)
gave results/ msg of lipomed, Dr Elease Hashimoto said the results were excellent no change in meds. Told to call back with questions, number provided.

## 2012-07-22 ENCOUNTER — Other Ambulatory Visit: Payer: Self-pay | Admitting: Cardiovascular Disease

## 2012-07-22 MED ORDER — ATORVASTATIN CALCIUM 20 MG PO TABS: 20.0000 mg | ORAL_TABLET | Freq: Every day | ORAL | Status: DC

## 2012-07-23 ENCOUNTER — Encounter: Payer: Self-pay | Admitting: Cardiovascular Disease

## 2012-08-04 ENCOUNTER — Other Ambulatory Visit: Payer: Self-pay | Admitting: *Deleted

## 2012-08-04 ENCOUNTER — Telehealth: Payer: Self-pay | Admitting: Cardiovascular Disease

## 2012-08-04 MED ORDER — LABETALOL HCL 100 MG PO TABS: 100.0000 mg | ORAL_TABLET | Freq: Two times a day (BID) | ORAL | Status: DC

## 2012-08-04 NOTE — Telephone Encounter (Signed)
Fax Received. Refill Completed. Dwayne Morris (R.M.A)   

## 2012-08-04 NOTE — Telephone Encounter (Signed)
labetalol 100mg  refill needed

## 2012-08-04 NOTE — Telephone Encounter (Signed)
Opened in Error.

## 2012-11-14 ENCOUNTER — Other Ambulatory Visit: Payer: Self-pay | Admitting: *Deleted

## 2012-11-14 DIAGNOSIS — I7100 Dissection of unspecified site of aorta: Secondary | ICD-10-CM

## 2012-11-18 ENCOUNTER — Other Ambulatory Visit: Payer: Self-pay | Admitting: *Deleted

## 2012-11-18 DIAGNOSIS — I7100 Dissection of unspecified site of aorta: Secondary | ICD-10-CM

## 2012-12-12 ENCOUNTER — Ambulatory Visit
Admission: RE | Admit: 2012-12-12 | Discharge: 2012-12-12 | Disposition: A | Discharge status: Home / Self Care | Payer: Medicare PPO | Source: Ambulatory Visit | Attending: Thoracic Surgery (Cardiothoracic Vascular Surgery) | Admitting: Thoracic Surgery (Cardiothoracic Vascular Surgery)

## 2012-12-12 ENCOUNTER — Inpatient Hospital Stay: Admission: RE | Admit: 2012-12-12 | Payer: Medicare Other | Source: Ambulatory Visit

## 2012-12-12 DIAGNOSIS — I7100 Dissection of unspecified site of aorta: Secondary | ICD-10-CM

## 2012-12-12 MED ORDER — IOHEXOL 350 MG/ML SOLN
100.0000 mL | Freq: Once | INTRAVENOUS | Status: AC | PRN
  Administered 2012-12-12: 100 mL via INTRAVENOUS

## 2012-12-15 ENCOUNTER — Ambulatory Visit (INDEPENDENT_AMBULATORY_CARE_PROVIDER_SITE_OTHER): Payer: Medicare PPO | Admitting: Thoracic Surgery (Cardiothoracic Vascular Surgery)

## 2012-12-15 ENCOUNTER — Encounter: Payer: Self-pay | Admitting: Thoracic Surgery (Cardiothoracic Vascular Surgery)

## 2012-12-15 ENCOUNTER — Ambulatory Visit: Payer: Medicare PPO | Admitting: Thoracic Surgery (Cardiothoracic Vascular Surgery)

## 2012-12-15 VITALS — BP 109/59 | HR 69 | Resp 16 | Ht 63.0 in | Wt 132.0 lb

## 2012-12-15 DIAGNOSIS — Z712 Person consulting for explanation of examination or test findings: Secondary | ICD-10-CM

## 2012-12-15 DIAGNOSIS — Z7189 Other specified counseling: Secondary | ICD-10-CM

## 2012-12-15 DIAGNOSIS — I7101 Dissection of thoracic aorta: Secondary | ICD-10-CM

## 2012-12-15 DIAGNOSIS — I712 Thoracic aortic aneurysm, without rupture: Secondary | ICD-10-CM

## 2012-12-15 DIAGNOSIS — I7100 Dissection of unspecified site of aorta: Secondary | ICD-10-CM | POA: Insufficient documentation

## 2012-12-15 NOTE — Progress Notes (Signed)
[address]            [place] [address]          [phone]     CARDIOTHORACIC SURGERY OFFICE NOTE  Referring Provider is Massie Maroon., MD PCP is Pearson Grippe, MD   HPI:  Patient returns for followup and surveillance for his thoracic aorta having originally sustained an acute type B aortic dissection in November 2008.  He was last seen here in the office on 12/11/2010. Since then he has continued to remained clinically stable from a cardiovascular standpoint. He is followed carefully by Dr. Elease Hashimoto in Dr. Selena Batten and his blood pressure has remained under very good control. He exercises on a regular basis and he reports no new medical problems or complaints. He specifically denies any history of pain in his chest or back which could be related to his thoracic aorta. He does seem to have increasing problems with short-term memory and a tendency for some confusion.   Current Outpatient Prescriptions  Medication Sig Dispense Refill  . Ascorbic Acid (VITAMIN C PO) Take 1 tablet by mouth daily.        Marland Kitchen ascorbic acid (VITAMIN C) 1000 MG tablet Take 1,000 mg by mouth daily.      Marland Kitchen aspirin 81 MG tablet Take 81 mg by mouth daily.        Marland Kitchen atorvastatin (LIPITOR) 20 MG tablet Take 1 tablet (20 mg total) by mouth at bedtime.  30 tablet  11  . Calcium Carbonate-Vitamin D (CALCIUM + D PO) Take 1 tablet by mouth daily.       . Choline Fenofibrate 135 MG capsule Take 1 capsule (135 mg total) by mouth daily.  90 capsule  3  . dutasteride (AVODART) 0.5 MG capsule Take 0.5 mg by mouth daily.        Marland Kitchen esomeprazole (NEXIUM) 40 MG capsule Take 40 mg by mouth daily before breakfast.        . fish oil-omega-3 fatty acids 1000 MG capsule Take 1 g by mouth daily.      Marland Kitchen labetalol (NORMODYNE) 100 MG tablet Take 1 tablet (100 mg total) by mouth 2 (two) times daily.  60 tablet  5  . Multiple Vitamin (MULITIVITAMIN WITH MINERALS) TABS Take 1 tablet by mouth daily.      Marland Kitchen VITAMIN E PO  Take 1 tablet by mouth daily.        No current facility-administered medications for this visit.      Physical Exam:   BP 109/59  Pulse 69  Resp 16  Ht 5\' 3"  (1.6 m)  Wt 132 lb (59.875 kg)  BMI 23.39 kg/m2  SpO2 96%  General:  Well-appearing  Chest:   Clear to auscultation  CV:   Regular rate and rhythm without murmur  Abdomen:  Soft and nontender  Extremities:  Warm and well-perfused, pulses intact  Diagnostic Tests:  *RADIOLOGY REPORT*  Clinical Data: Follow-up aortic dissection repair for type B  dissection.  CT ANGIOGRAPHY CHEST, ABDOMEN AND PELVIS  Technique: Multidetector CT imaging through the chest, abdomen and  pelvis was performed using the standard protocol during bolus  administration of intravenous contrast. Multiplanar reconstructed  images including MIPs were obtained and reviewed to evaluate the  vascular anatomy.  Contrast: OMNIPAQUE IOHEXOL 350 MG/ML SOLN  Comparison: 12/11/2010  CTA CHEST  Findings: Stable mild aneurysmal dilatation  of the ascending  thoracic aorta which measures 4.5 cm. Mild saccular aneurysmal  dilatation of the aortic arch measuring up to 4.0 cm compared 3.9  cm previously. Descending thoracic aorta has a maximum diameter  proximally of 2.9 cm. No evidence of aortic dissection. Mild  cardiomegaly.  No filling defects in the pulmonary arteries to suggest pulmonary  emboli. Prior CABG. No mediastinal, hilar, or axillary adenopathy.  Visualized thyroid and chest wall soft tissues unremarkable.  Dependent atelectasis posteriorly in the lungs. No pleural  effusions or pericardial effusion.  Review of the MIP images confirms the above findings.  IMPRESSION:  Stable mild aortic dilatation of the ascending thoracic aorta and  aortic arch. No evidence of recurrent dissection.  CTA ABDOMEN AND PELVIS  Findings: No evidence of aortic aneurysm or dissection. Maximum  diameter of the abdominal aorta is proximal, near the aortic  hiatus  measuring 2.8 cm. Ectasia of the common iliac arteries  bilaterally, 1.7 cm on the right and 1.4 cm on the left, not  significantly changed. Calcified plaque at the origin of the celiac  artery without significant stenosis. Superior and inferior  mesenteric arteries are patent. Renal arteries are patent.  Benign-appearing bilateral renal cysts are again noted, stable.  Liver, gallbladder, spleen, stomach, pancreas, adrenals are  unremarkable. No hydronephrosis.  Enlargement of the prostate which measures 6.1 x 6.1 cm. Urinary  bladder is grossly unremarkable. Descending colonic and sigmoid  diverticulosis. No active diverticulitis. Small bowel is  decompressed. No free fluid, free air or adenopathy. Calcified  structures in the right abdomen likely represent calcified lymph  nodes and are stable.  No acute or focal bony abnormality. Degenerative changes in the  lumbar spine.  Review of the MIP images confirms the above findings.  IMPRESSION:  No evidence of aortic aneurysm or dissection. Stable ectasia of  the common iliac arteries.  Stable bilateral renal cysts.  Original Report Authenticated By: Charlett Nose, M.D.     Impression:  The patient's previous aortic dissection healed completely with no residual false lumen or significant aneurysmal enlargement of the descending thoracic aorta. He does have mild aneurysmal enlargement of the ascending thoracic aorta and transverse aortic arch which remains clinically stable. His blood pressure remained under good control.   Plan:  We will have the patient return in one years time for routine followup and surveillance.   Salvatore Decent. [name], MD 12/15/2012 3:22 PM

## 2012-12-17 ENCOUNTER — Other Ambulatory Visit: Payer: Self-pay | Admitting: Cardiology

## 2012-12-23 ENCOUNTER — Telehealth: Payer: Self-pay | Admitting: *Deleted

## 2012-12-23 NOTE — Telephone Encounter (Signed)
lmfor pt to call back to see if he is still taking his trilipix 135mg  because his pharmacy is requesting it. medication is not on pt med list. number provided.

## 2012-12-29 ENCOUNTER — Other Ambulatory Visit: Payer: Self-pay | Admitting: *Deleted

## 2012-12-29 MED ORDER — CHOLINE FENOFIBRATE 135 MG PO CPDR: 135.0000 mg | DELAYED_RELEASE_CAPSULE | Freq: Every day | ORAL | Status: DC

## 2012-12-30 ENCOUNTER — Other Ambulatory Visit: Payer: Self-pay | Admitting: *Deleted

## 2013-01-26 ENCOUNTER — Other Ambulatory Visit: Payer: Self-pay | Admitting: *Deleted

## 2013-01-26 MED ORDER — LABETALOL HCL 100 MG PO TABS: 100.0000 mg | ORAL_TABLET | Freq: Two times a day (BID) | ORAL | Status: DC

## 2013-01-26 NOTE — Telephone Encounter (Signed)
Fax Received. Refill Completed. Dwayne Morris (R.M.A)   

## 2013-02-24 ENCOUNTER — Ambulatory Visit (INDEPENDENT_AMBULATORY_CARE_PROVIDER_SITE_OTHER): Payer: Medicare PPO | Admitting: Cardiovascular Disease

## 2013-02-24 ENCOUNTER — Encounter: Payer: Self-pay | Admitting: Cardiovascular Disease

## 2013-02-24 VITALS — BP 114/58 | HR 63 | Ht 63.0 in | Wt 135.0 lb

## 2013-02-24 DIAGNOSIS — C61 Malignant neoplasm of prostate: Secondary | ICD-10-CM

## 2013-02-24 DIAGNOSIS — E785 Hyperlipidemia, unspecified: Secondary | ICD-10-CM

## 2013-02-24 DIAGNOSIS — I7101 Dissection of thoracic aorta: Secondary | ICD-10-CM

## 2013-02-24 DIAGNOSIS — Z8679 Personal history of other diseases of the circulatory system: Secondary | ICD-10-CM

## 2013-02-24 DIAGNOSIS — I7100 Dissection of unspecified site of aorta: Secondary | ICD-10-CM

## 2013-02-24 LAB — LIPID PANEL
HDL: 62.3 mg/dL (ref >39.00)
LDL Cholesterol: 78 mg/dL (ref 0–99)
Total CHOL/HDL Ratio: 2
Triglycerides: 39 mg/dL (ref 0.0–149.0)

## 2013-02-24 LAB — BASIC METABOLIC PANEL
CO2: 24 mEq/L (ref 19–32)
Chloride: 106 mEq/L (ref 96–112)
Glucose, Bld: 94 mg/dL (ref 70–99)
Sodium: 138 mEq/L (ref 135–145)

## 2013-02-24 LAB — HEPATIC FUNCTION PANEL
Albumin: 4.2 g/dL (ref 3.5–5.2)
Total Bilirubin: 0.9 mg/dL (ref 0.3–1.2)

## 2013-02-24 LAB — PSA: PSA: 3.83 ng/mL (ref 0.10–4.00)

## 2013-02-24 NOTE — Patient Instructions (Addendum)
Your physician recommends that you return for a FASTING lipid profile: today   Your physician wants you to follow-up in: 6 months  You will receive a reminder letter in the mail two months in advance. If you don't receive a letter, please call our office to schedule the follow-up appointment. Your physician recommends that you return for a FASTING lipid profile: 6 months

## 2013-02-24 NOTE — Assessment & Plan Note (Signed)
Will check PSA today

## 2013-02-24 NOTE — Assessment & Plan Note (Signed)
His BP is stable.  His distal pulses are 2 +

## 2013-02-24 NOTE — Progress Notes (Signed)
Dwayne Morris Date of Birth  May 30, 1927 [facility]     [place] Office  [address]    [address]   [address] [place], [place]  [address]    [place], [place]  [address] [phone]  Fax  [phone]  [phone]  Fax [phone]  Problem list: 1. Type III aortic dissection 2. Hypertension 3. Prostate cancer  History of Present Illness:   Dwayne Morris is an 79 year old gentleman originally from [place]. He is done quite well since I last saw him 6 months ago. [name] not had any problems with his blood pressure. [name] been taking his medications the records his blood pressure on a regular basis.  He has a history of an aortic dissection. [name] not had any further episodes of chest pain. We have followed him with CT scans and his most recent CT scans have not shown any worsening of his dissection.  February 24, 2013:  Dwayne Morris is doing well.  Still swiimming regularly.  No CP , no dyspnea.   His CT angio of the aorta which was unremarkable.    He has been found to have a left inguinal hernia.   Current Outpatient Prescriptions on File Prior to Visit  Medication Sig Dispense Refill  . Ascorbic Acid (VITAMIN C PO) Take 1 tablet by mouth daily.        Marland Kitchen ascorbic acid (VITAMIN C) 1000 MG tablet Take 1,000 mg by mouth daily.      Marland Kitchen aspirin 81 MG tablet Take 81 mg by mouth daily.        Marland Kitchen atorvastatin (LIPITOR) 20 MG tablet Take 1 tablet (20 mg total) by mouth at bedtime.  30 tablet  11  . Calcium Carbonate-Vitamin D (CALCIUM + D PO) Take 1 tablet by mouth daily.       Marland Kitchen dutasteride (AVODART) 0.5 MG capsule Take 0.5 mg by mouth daily.        Marland Kitchen esomeprazole (NEXIUM) 40 MG capsule Take 40 mg by mouth daily before breakfast.        . fish oil-omega-3 fatty acids 1000 MG capsule Take 1 g by mouth daily.      Marland Kitchen labetalol (NORMODYNE) 100 MG tablet Take 1 tablet (100 mg total) by mouth 2 (two) times daily.  60 tablet  5  . Multiple Vitamin (MULITIVITAMIN WITH MINERALS) TABS Take 1 tablet by mouth  daily.      Marland Kitchen VITAMIN E PO Take 1 tablet by mouth daily.        No current facility-administered medications on file prior to visit.   Labetalol 100 mg PO BID   No Known Allergies  Past Medical History  Diagnosis Date  . Hypertension   . Aortic dissection     type B aortic dissection in November 2008  . Prostate cancer     PSA levels have decreased with garlic therapy  . Stroke 1996    History of right vertebral artery dissection with stroke    Past Surgical History  Procedure Laterality Date  . Colonoscopy  09/14/2011    Procedure: COLONOSCOPY;  Surgeon: Theda Belfast, MD;  Location: [facility] ENDOSCOPY;  Service: Endoscopy;  Laterality: N/A;    History  Smoking status  . Former Smoker -- 1.00 packs/day for 30 years  . Types: Cigarettes  . Quit date: 05/15/1974  Smokeless tobacco  . Not on file    History  Alcohol Use No    No family history on file.  Reviw of  Systems:  Reviewed in the HPI.  All other systems are negative.  Physical Exam: Blood pressure 114/58, pulse 63, height 5\' 3"  (1.6 m), weight 135 lb (61.236 kg). General: Well developed, well nourished, in no acute distress.  Head: Normocephalic, atraumatic, sclera non-icteric, mucus membranes are moist,   Neck: Supple. Carotids are 2 + without bruits. No JVD  Lungs: Clear bilaterally to auscultation.  Heart: regular rate.  normal  S1 S2. No murmurs, gallops or rubs.  Abdomen: Soft, non-tender, non-distended with normal bowel sounds. No hepatomegaly. No rebound/guarding. No masses.  Msk:  Strength and tone are normal  Extremities: No clubbing or cyanosis. No edema.  Distal pedal pulses are 2+ and equal bilaterally.  Neuro: Alert and oriented X 3. Moves all extremities spontaneously.  Psych:  Responds to questions appropriately with a normal affect.  ECG: February 24, 2013:  NSR at 47.  Normal ECG.  Assessment / Plan:

## 2013-03-10 ENCOUNTER — Telehealth: Payer: Self-pay | Admitting: Cardiovascular Disease

## 2013-03-10 NOTE — Telephone Encounter (Signed)
Labs reviewed and mailed a copy

## 2013-03-10 NOTE — Telephone Encounter (Signed)
Follow up ° °Pt is calling regarding his lab work °

## 2013-05-01 ENCOUNTER — Other Ambulatory Visit: Payer: Self-pay | Admitting: Podiatry

## 2013-07-09 ENCOUNTER — Other Ambulatory Visit: Payer: Self-pay | Admitting: Podiatry

## 2013-07-21 ENCOUNTER — Other Ambulatory Visit: Payer: Self-pay

## 2013-07-21 MED ORDER — ATORVASTATIN CALCIUM 20 MG PO TABS: 20.0000 mg | ORAL_TABLET | Freq: Every day | ORAL | Status: DC

## 2013-08-09 ENCOUNTER — Other Ambulatory Visit: Payer: Self-pay | Admitting: Cardiovascular Disease

## 2013-09-07 ENCOUNTER — Encounter: Payer: Self-pay | Admitting: Cardiovascular Disease

## 2013-09-07 ENCOUNTER — Ambulatory Visit (INDEPENDENT_AMBULATORY_CARE_PROVIDER_SITE_OTHER): Payer: Medicare PPO | Admitting: Cardiovascular Disease

## 2013-09-07 VITALS — BP 98/64 | HR 57 | Ht 63.0 in | Wt 141.8 lb

## 2013-09-07 DIAGNOSIS — I10 Essential (primary) hypertension: Secondary | ICD-10-CM

## 2013-09-07 DIAGNOSIS — I999 Unspecified disorder of circulatory system: Secondary | ICD-10-CM

## 2013-09-07 DIAGNOSIS — I7100 Dissection of unspecified site of aorta: Secondary | ICD-10-CM

## 2013-09-07 NOTE — Patient Instructions (Signed)
Your physician wants you to follow-up in: 6 MONTHS You will receive a reminder letter in the mail two months in advance. If you don't receive a letter, please call our office to schedule the follow-up appointment.  Your physician recommends that you continue on your current medications as directed. Please refer to the Current Medication list given to you today.  Your physician recommends that you return for a FASTING lipid profile: TODAY BMET LIPID LIVER

## 2013-09-07 NOTE — Assessment & Plan Note (Signed)
Dwayne Morris is doing well .  He has not had any further issues with his Type III aortic dissection.   His BP is in the normal range.    He is avtive and exercises 6 times a week.  Continue with her current meds.  I will see in 6 months.

## 2013-09-07 NOTE — Progress Notes (Signed)
[name] Date of Birth  1927-02-22 Dwayne Morris [address]    [place]   [place], [place]  [address]    [place], [place]  [address] [phone]  Fax  [phone]  [phone]  Fax [phone]  Problem list: 1. Type III aortic dissection 2. Hypertension 3. Prostate cancer  History of Present Illness:   Dwayne Morris is an 80 year old gentleman originally from [place]. He is done quite well since I last saw him 6 months ago. [name] not had any problems with his blood pressure. [name] been taking his medications the records his blood pressure on a regular basis.  He has a history of an aortic dissection. [name] not had any further episodes of chest pain. We have followed him with CT scans and his most recent CT scans have not shown any worsening of his dissection.  February 24, 2013:  Dwayne Morris is doing well.  Still swiimming regularly.  No CP , no dyspnea.   His CT angio of the aorta which was unremarkable.    He has been found to have a left inguinal hernia.   Jan. 12, 2015:  Dwayne Morris is doing well.  He has been to [place] since last saw him.  Still swimming reguarly.    Current Outpatient Prescriptions on File Prior to Visit  Medication Sig Dispense Refill  . ascorbic acid (VITAMIN C) 1000 MG tablet Take 1,000 mg by mouth daily.      Marland Kitchen aspirin 81 MG tablet Take 81 mg by mouth daily.        Marland Kitchen atorvastatin (LIPITOR) 20 MG tablet Take 1 tablet (20 mg total) by mouth at bedtime.  30 tablet  4  . Calcium Carbonate-Vitamin D (CALCIUM + D PO) Take 1 tablet by mouth 2 (two) times daily.       . ciclopirox (PENLAC) 8 % solution APPLY TO AFFECTED AREA ONCE DAILY AS DIRECTED  6.6 mL  12  . dutasteride (AVODART) 0.5 MG capsule Take 0.5 mg by mouth daily.        Marland Kitchen esomeprazole (NEXIUM) 40 MG capsule Take 40 mg by mouth daily before breakfast.        . fish oil-omega-3 fatty acids 1000 MG capsule Take 1 g by mouth daily.      Marland Kitchen labetalol (NORMODYNE) 100 MG tablet TAKE  1 TABLET (100 MG TOTAL) BY MOUTH 2 (TWO) TIMES DAILY.  60 tablet  0  . Multiple Vitamin (MULITIVITAMIN WITH MINERALS) TABS Take 1 tablet by mouth daily.      . TRILIPIX 135 MG capsule Take 1 capsule by mouth daily.      Marland Kitchen VITAMIN E PO Take 400 mg by mouth daily.        No current facility-administered medications on file prior to visit.   Labetalol 100 mg PO BID   No Known Allergies  Past Medical History  Diagnosis Date  . Hypertension   . Aortic dissection     type B aortic dissection in November 2008  . Prostate cancer     PSA levels have decreased with garlic therapy  . Stroke 1996    History of right vertebral artery dissection with stroke    Past Surgical History  Procedure Laterality Date  . Colonoscopy  09/14/2011    Procedure: COLONOSCOPY;  Surgeon: Beryle Beams, MD;  Location[facility]OSCOPY;  Service: Endoscopy;  Laterality: N/A;    History  Smoking status  .  Former Smoker -- 1.00 packs/day for 30 years  . Types: Cigarettes  . Quit date: 05/15/1974  Smokeless tobacco  . Not on file    History  Alcohol Use No    No family history on file.  Reviw of Systems:  Reviewed in the HPI.  All other systems are negative.  Physical Exam: Blood pressure 98/64, pulse 57, height 5\' 3"  (1.6 m), weight 141 lb 12.8 oz (64.32 kg). General: Well developed, well nourished, in no acute distress.  Head: Normocephalic, atraumatic, sclera non-icteric, mucus membranes are moist,   Neck: Supple. Carotids are 2 + without bruits. No JVD  Lungs: Clear bilaterally to auscultation.  Heart: regular rate.  normal  S1 S2. No murmurs, gallops or rubs.  Abdomen: Soft, non-tender, non-distended with normal bowel sounds. No hepatomegaly. No rebound/guarding. No masses.  Msk:  Strength and tone are normal  Extremities: No clubbing or cyanosis. No edema.  Distal pedal pulses are 2+ and equal bilaterally.  Neuro: Alert and oriented X 3. Moves all extremities spontaneously.  Psych:   Responds to questions appropriately with a normal affect.  ECG:.  Assessment / Plan:

## 2013-09-08 ENCOUNTER — Other Ambulatory Visit: Payer: Self-pay | Admitting: Cardiovascular Disease

## 2013-09-08 LAB — BASIC METABOLIC PANEL
BUN: 18 mg/dL (ref 6–23)
CALCIUM: 9.4 mg/dL (ref 8.4–10.5)
CO2: 27 meq/L (ref 19–32)
CREATININE: 0.9 mg/dL (ref 0.4–1.5)
Chloride: 108 mEq/L (ref 96–112)
GFR: 91.08 mL/min (ref >60.00)
GLUCOSE: 91 mg/dL (ref 70–99)
Potassium: 4.1 mEq/L (ref 3.5–5.1)
Sodium: 140 mEq/L (ref 135–145)

## 2013-09-08 LAB — LIPID PANEL
CHOL/HDL RATIO: 2
Cholesterol: 135 mg/dL (ref 0–200)
HDL: 61.6 mg/dL (ref >39.00)
LDL Cholesterol: 67 mg/dL (ref 0–99)
Triglycerides: 33 mg/dL (ref 0.0–149.0)
VLDL: 6.6 mg/dL (ref 0.0–40.0)

## 2013-09-08 LAB — HEPATIC FUNCTION PANEL
ALK PHOS: 37 U/L — AB (ref 39–117)
ALT: 22 U/L (ref 0–53)
AST: 25 U/L (ref 0–37)
Albumin: 4.1 g/dL (ref 3.5–5.2)
BILIRUBIN DIRECT: 0.2 mg/dL (ref 0.0–0.3)
BILIRUBIN TOTAL: 0.9 mg/dL (ref 0.3–1.2)
Total Protein: 6.8 g/dL (ref 6.0–8.3)

## 2013-09-18 ENCOUNTER — Other Ambulatory Visit: Payer: Self-pay | Admitting: Podiatry

## 2013-11-10 ENCOUNTER — Other Ambulatory Visit: Payer: Self-pay

## 2013-11-10 DIAGNOSIS — I712 Thoracic aortic aneurysm, without rupture, unspecified: Secondary | ICD-10-CM

## 2013-12-10 ENCOUNTER — Other Ambulatory Visit: Payer: Self-pay

## 2013-12-10 ENCOUNTER — Other Ambulatory Visit: Payer: Self-pay | Admitting: *Deleted

## 2013-12-10 ENCOUNTER — Other Ambulatory Visit: Payer: Self-pay | Admitting: Thoracic Surgery (Cardiothoracic Vascular Surgery)

## 2013-12-10 DIAGNOSIS — I71019 Dissection of thoracic aorta, unspecified: Secondary | ICD-10-CM

## 2013-12-10 DIAGNOSIS — I7101 Dissection of thoracic aorta: Secondary | ICD-10-CM

## 2013-12-10 LAB — BUN: BUN: 19 mg/dL (ref 6–23)

## 2013-12-11 LAB — CREATININE, SERUM: Creat: 0.98 mg/dL (ref 0.50–1.35)

## 2013-12-14 ENCOUNTER — Ambulatory Visit
Admission: RE | Admit: 2013-12-14 | Discharge: 2013-12-14 | Disposition: A | Discharge status: Home / Self Care | Payer: Medicare PPO | Source: Ambulatory Visit | Attending: Thoracic Surgery (Cardiothoracic Vascular Surgery) | Admitting: Thoracic Surgery (Cardiothoracic Vascular Surgery)

## 2013-12-14 ENCOUNTER — Encounter: Payer: Self-pay | Admitting: Thoracic Surgery (Cardiothoracic Vascular Surgery)

## 2013-12-14 ENCOUNTER — Ambulatory Visit (INDEPENDENT_AMBULATORY_CARE_PROVIDER_SITE_OTHER): Payer: Medicare PPO | Admitting: Thoracic Surgery (Cardiothoracic Vascular Surgery)

## 2013-12-14 VITALS — BP 106/62 | HR 66 | Resp 16 | Ht 63.0 in | Wt 132.0 lb

## 2013-12-14 DIAGNOSIS — Z8679 Personal history of other diseases of the circulatory system: Secondary | ICD-10-CM

## 2013-12-14 DIAGNOSIS — Z7189 Other specified counseling: Secondary | ICD-10-CM

## 2013-12-14 DIAGNOSIS — I712 Thoracic aortic aneurysm, without rupture, unspecified: Secondary | ICD-10-CM

## 2013-12-14 DIAGNOSIS — I71019 Dissection of thoracic aorta, unspecified: Secondary | ICD-10-CM

## 2013-12-14 DIAGNOSIS — I7100 Dissection of unspecified site of aorta: Secondary | ICD-10-CM

## 2013-12-14 DIAGNOSIS — I7101 Dissection of thoracic aorta: Secondary | ICD-10-CM

## 2013-12-14 DIAGNOSIS — Z712 Person consulting for explanation of examination or test findings: Secondary | ICD-10-CM

## 2013-12-14 MED ORDER — IOHEXOL 350 MG/ML SOLN
80.0000 mL | Freq: Once | INTRAVENOUS | Status: AC | PRN
  Administered 2013-12-14: 80 mL via INTRAVENOUS

## 2013-12-14 NOTE — Progress Notes (Signed)
[address]       [place] [address]             [phone]     CARDIOTHORACIC SURGERY OFFICE NOTE  Referring Provider is Thayer Headings, MD PCP is [name], MD   HPI:  Patient returns for followup and surveillance for his thoracic aorta having originally sustained an acute type B aortic dissection in November 2008. His original aortic dissection was quite limited and subsequent followup CT angiography demonstrates that the dissection itself has healed completely and resolved. He does have mild aneurysmal enlargement of the ascending thoracic aorta.  He was last seen here in the office on 12/15/2012. He is followed carefully by Dr. Acie Fredrickson in Dr. Maudie Mercury and his blood pressure has remained under very good control. He exercises on a regular basis and he reports no new medical problems or complaints. He continues to swim half a mile 3 or 4 times a week and he reports no exercise intolerance. He has no history of any chest pain or back pain.   Current Outpatient Prescriptions  Medication Sig Dispense Refill  . ascorbic acid (VITAMIN C) 1000 MG tablet Take 1,000 mg by mouth daily.      Marland Kitchen aspirin 81 MG tablet Take 81 mg by mouth daily.        Marland Kitchen atorvastatin (LIPITOR) 20 MG tablet Take 1 tablet (20 mg total) by mouth at bedtime.  30 tablet  4  . Calcium Carbonate-Vitamin D (CALCIUM + D PO) Take 1 tablet by mouth 2 (two) times daily.       . ciclopirox (PENLAC) 8 % solution APPLY TO AFFECTED AREA ONCE DAILY AS DIRECTED  6.6 mL  3  . dutasteride (AVODART) 0.5 MG capsule Take 0.5 mg by mouth daily.        Marland Kitchen esomeprazole (NEXIUM) 40 MG capsule Take 40 mg by mouth daily before breakfast.        . fish oil-omega-3 fatty acids 1000 MG capsule Take 1 g by mouth as needed.       . labetalol (NORMODYNE) 100 MG tablet TAKE 1 TABLET (100 MG TOTAL) BY MOUTH 2 (TWO) TIMES DAILY.  60 tablet  5  . Multiple Vitamin (MULITIVITAMIN WITH MINERALS) TABS Take 1 tablet by mouth daily.        . TRILIPIX 135 MG capsule Take 1 capsule by mouth daily.      Marland Kitchen VITAMIN E PO Take 400 mg by mouth every other day.        No current facility-administered medications for this visit.      Physical Exam:   BP 106/62  Pulse 66  Resp 16  Ht 5\' 3"  (1.6 m)  Wt 132 lb (59.875 kg)  BMI 23.39 kg/m2  SpO2 96%  General:  Well-appearing  Chest:   clear  CV:   Regular rate and rhythm without murmur  Incisions:  n/a  Abdomen:  soft  Extremities:  nontender  Diagnostic Tests:  CT ANGIOGRAPHY CHEST WITH CONTRAST  TECHNIQUE:  Multidetector CT imaging of the chest was performed using the  standard protocol during bolus administration of intravenous  contrast. Multiplanar CT image reconstructions and MIPs were  obtained to evaluate the vascular anatomy.  CONTRAST: 36mL OMNIPAQUE IOHEXOL 350 MG/ML SOLN  COMPARISON: Most recent prior CT a chest 12/12/2012  FINDINGS:  Mediastinum: Small 6 mm nodule in the inferior aspect of the left  thyroid gland may have slightly increased in size  compared to April  of 2012. No mediastinal mass or suspicious adenopathy. Unremarkable  thoracic esophagus.  Heart/Vascular: Essentially stable fusiform aneurysmal dilatation of  the ascending thoracic aorta to 4.6 cm. The aortic root remains  dilated at approximately 4.7 cm. There is mild thickening and  calcification of the aortic valve. No effacement of the sino-tubular  junction. Focal saccular ectasia of the transverse aorta is also  essentially stable at 4.1 cm which is insignificantly changed  compared to 4.0 cm previously. Unchanged cardiomegaly.  Atherosclerotic calcifications in the coronary arteries. Several  small venous collaterals are incidentally noted throughout the  mediastinum. The venous collaterals drain through the inferior  phrenic and into the renal vein. No pericardial effusion.  Lungs/Pleura: No pleural effusion. Dependent atelectasis in the  lower lobes. No suspicious pulmonary  nodule or mass.  Bones/Soft Tissues: No acute fracture or aggressive appearing lytic  or blastic osseous lesion.  Upper Abdomen: Is stable incompletely imaged renal cysts. Calcified  ileocolic lymph node also unchanged. Periampullary duodenum  diverticulum. No new abnormality or significant interval change.  Review of the MIP images confirms the above findings.  IMPRESSION:  1. Stable fusiform aneurysmal dilatation of the ascending thoracic  aorta to 4.6 cm, and focal mild aneurysmal dilatation of the  transverse aorta to 4.1 cm.  2. Additional ancillary findings as above without significant  interval change.  Signed,  Criselda Peaches, MD  [facility]  [facility]  Electronically Signed  By: Jacqulynn Cadet M.D.  On: 12/14/2013 16:27    Impression:  Stable radiographic appearance of the thoracic aorta with mild aneurysmal enlargement of the ascending thoracic aorta and complete resolution of previous limited aortic dissection involving the descending thoracic aorta.  Plan:  The patient will return in 2 years for followup CT angiography.  I spent in excess of 15 minutes during the conduct of this office consultation and >50% of this time involved direct face-to-face encounter with the patient for counseling and/or coordination of their care.  Valentina Gu. Roxy Manns, MD 12/14/2013 2:33 PM

## 2013-12-16 ENCOUNTER — Other Ambulatory Visit: Payer: Self-pay

## 2013-12-18 ENCOUNTER — Other Ambulatory Visit: Payer: Self-pay

## 2013-12-18 MED ORDER — CHOLINE FENOFIBRATE 135 MG PO CPDR: 135.0000 mg | DELAYED_RELEASE_CAPSULE | Freq: Every day | ORAL | Status: DC

## 2014-01-10 ENCOUNTER — Other Ambulatory Visit: Payer: Self-pay | Admitting: Cardiovascular Disease

## 2014-03-12 ENCOUNTER — Encounter: Payer: Self-pay | Admitting: Cardiovascular Disease

## 2014-03-20 ENCOUNTER — Other Ambulatory Visit: Payer: Self-pay | Admitting: Cardiovascular Disease

## 2014-03-24 ENCOUNTER — Other Ambulatory Visit: Payer: Self-pay

## 2014-03-24 MED ORDER — CHOLINE FENOFIBRATE 135 MG PO CPDR: 135.0000 mg | DELAYED_RELEASE_CAPSULE | Freq: Every day | ORAL | Status: DC

## 2014-04-14 ENCOUNTER — Encounter: Payer: Self-pay | Admitting: Cardiovascular Disease

## 2014-04-14 ENCOUNTER — Ambulatory Visit (INDEPENDENT_AMBULATORY_CARE_PROVIDER_SITE_OTHER): Payer: Medicare PPO | Admitting: Cardiovascular Disease

## 2014-04-14 VITALS — BP 92/60 | HR 73 | Wt 138.0 lb

## 2014-04-14 DIAGNOSIS — I712 Thoracic aortic aneurysm, without rupture, unspecified: Secondary | ICD-10-CM

## 2014-04-14 DIAGNOSIS — I10 Essential (primary) hypertension: Secondary | ICD-10-CM

## 2014-04-14 NOTE — Patient Instructions (Signed)
Your physician recommends that you continue on your current medications as directed. Please refer to the Current Medication list given to you today.  Your physician wants you to follow-up in: 6 months with Dr. Nahser.  You will receive a reminder letter in the mail two months in advance. If you don't receive a letter, please call our office to schedule the follow-up appointment.  

## 2014-04-14 NOTE — Progress Notes (Signed)
[name] Date of Birth  [birth year] [place] [address]    [place]   [place], [place]  [address]    [place], [place]  [address] [phone]  Fax  [phone]  [phone]  Fax [phone]  Problem list: 1. Type III aortic dissection 2. Hypertension 3. Prostate cancer  History of Present Illness:   Dwayne Morris is an 80 year old gentleman originally from [place]. He is done quite well since I last saw him 6 months ago. [name] not had any problems with his blood pressure. [name] been taking his medications the records his blood pressure on a regular basis.  He has a history of an aortic dissection. [name] not had any further episodes of chest pain. We have followed him with CT scans and his most recent CT scans have not shown any worsening of his dissection.  February 24, 2013:  Dwayne Morris is doing well.  Still swiimming regularly.  No CP , no dyspnea.   His CT angio of the aorta which was unremarkable.    He has been found to have a left inguinal hernia.   Jan. 12, 2015:  Dwayne Morris is doing well.  He has been to [place] since last saw him.  Still swimming reguarly.    April 14, 2014:  Doing well.  No CP or issues.  Still swimming regulalry.   He has noticed that on occasion is lower than 100 - he typically holds his labetalol no size. The following morning the blood pressures usually normal.  Current Outpatient Prescriptions on File Prior to Visit  Medication Sig Dispense Refill  . ascorbic acid (VITAMIN C) 1000 MG tablet Take 1,000 mg by mouth daily.      Marland Kitchen aspirin 81 MG tablet Take 81 mg by mouth daily.        Marland Kitchen atorvastatin (LIPITOR) 20 MG tablet TAKE 1 TABLET (20 MG TOTAL) BY MOUTH AT BEDTIME.  30 tablet  4  . Choline Fenofibrate (TRILIPIX) 135 MG capsule Take 1 capsule (135 mg total) by mouth daily.  90 capsule  0  . dutasteride (AVODART) 0.5 MG capsule Take 0.5 mg by mouth daily.        Marland Kitchen esomeprazole (NEXIUM) 40 MG capsule Take 40 mg by mouth  daily before breakfast.        . fish oil-omega-3 fatty acids 1000 MG capsule Take 1 g by mouth as needed.       . labetalol (NORMODYNE) 100 MG tablet TAKE 1 TABLET (100 MG TOTAL) BY MOUTH 2 (TWO) TIMES DAILY.  60 tablet  5  . Multiple Vitamin (MULITIVITAMIN WITH MINERALS) TABS Take 1 tablet by mouth daily.      Marland Kitchen VITAMIN E PO Take 400 mg by mouth every other day.        No current facility-administered medications on file prior to visit.   Labetalol 100 mg PO BID   No Known Allergies  Past Medical History  Diagnosis Date  . Hypertension   . Aortic dissection     type B aortic dissection in November 2008  . Prostate cancer     PSA levels have decreased with garlic therapy  . Stroke 1996    History of right vertebral artery dissection with stroke  . Thoracic aortic aneurysm     Past Surgical History  Procedure Laterality Date  . Colonoscopy  09/14/2011    Procedure: COLONOSCOPY;  Surgeon: Beryle Beams, MD;  Location: [facility] ENDOSCOPY;  Service: Endoscopy;  Laterality: N/A;    History  Smoking status  . Former Smoker -- 1.00 packs/day for 30 years  . Types: Cigarettes  . Quit date: 05/15/1974  Smokeless tobacco  . Not on file    History  Alcohol Use No    No family history on file.  Reviw of Systems:  Reviewed in the HPI.  All other systems are negative.  Physical Exam: Blood pressure 92/60, pulse 73, weight 138 lb (62.596 kg). General: Well developed, well nourished, in no acute distress.  Head: Normocephalic, atraumatic, sclera non-icteric, mucus membranes are moist,   Neck: Supple. Carotids are 2 + without bruits. No JVD  Lungs: Clear bilaterally to auscultation.  Heart: regular rate.  normal  S1 S2. No murmurs, gallops or rubs.  Abdomen: Soft, non-tender, non-distended with normal bowel sounds. No hepatomegaly. No rebound/guarding. No masses.  Msk:  Strength and tone are normal  Extremities: No clubbing or cyanosis. No edema.  Distal pedal pulses are 2+  and equal bilaterally.  Neuro: Alert and oriented X 3. Moves all extremities spontaneously.  Psych:  Responds to questions appropriately with a normal affect.  ECG:.  Apr 14, 2014:  NSR at 73, normal  ECG  Assessment / Plan:

## 2014-06-16 ENCOUNTER — Other Ambulatory Visit: Payer: Self-pay | Admitting: Cardiovascular Disease

## 2014-09-15 ENCOUNTER — Other Ambulatory Visit: Payer: Self-pay | Admitting: Cardiovascular Disease

## 2014-09-27 ENCOUNTER — Other Ambulatory Visit: Payer: Self-pay | Admitting: Podiatry

## 2014-10-04 ENCOUNTER — Other Ambulatory Visit: Payer: Self-pay | Admitting: Gastroenterology

## 2014-10-14 ENCOUNTER — Ambulatory Visit (INDEPENDENT_AMBULATORY_CARE_PROVIDER_SITE_OTHER): Payer: Medicare PPO | Admitting: Cardiovascular Disease

## 2014-10-14 ENCOUNTER — Encounter: Payer: Self-pay | Admitting: Cardiovascular Disease

## 2014-10-14 VITALS — BP 112/80 | HR 64 | Ht 63.0 in | Wt 140.0 lb

## 2014-10-14 DIAGNOSIS — Z79899 Other long term (current) drug therapy: Secondary | ICD-10-CM

## 2014-10-14 DIAGNOSIS — I712 Thoracic aortic aneurysm, without rupture, unspecified: Secondary | ICD-10-CM

## 2014-10-14 DIAGNOSIS — Z13220 Encounter for screening for lipoid disorders: Secondary | ICD-10-CM | POA: Diagnosis not present

## 2014-10-14 LAB — LIPID PANEL
Cholesterol: 122 mg/dL (ref 0–200)
HDL: 51.6 mg/dL (ref >39.00)
LDL Cholesterol: 48 mg/dL (ref 0–99)
NonHDL: 70.4
TRIGLYCERIDES: 110 mg/dL (ref 0.0–149.0)
Total CHOL/HDL Ratio: 2
VLDL: 22 mg/dL (ref 0.0–40.0)

## 2014-10-14 LAB — BASIC METABOLIC PANEL
BUN: 30 mg/dL — ABNORMAL HIGH (ref 6–23)
CALCIUM: 9.6 mg/dL (ref 8.4–10.5)
CO2: 29 mEq/L (ref 19–32)
Chloride: 103 mEq/L (ref 96–112)
Creatinine, Ser: 0.9 mg/dL (ref 0.40–1.50)
GFR: 85.04 mL/min (ref >60.00)
Glucose, Bld: 93 mg/dL (ref 70–99)
Potassium: 4.2 mEq/L (ref 3.5–5.1)
SODIUM: 136 meq/L (ref 135–145)

## 2014-10-14 LAB — HEPATIC FUNCTION PANEL
ALT: 19 U/L (ref 0–53)
AST: 27 U/L (ref 0–37)
Albumin: 4.2 g/dL (ref 3.5–5.2)
Alkaline Phosphatase: 41 U/L (ref 39–117)
BILIRUBIN DIRECT: 0.2 mg/dL (ref 0.0–0.3)
TOTAL PROTEIN: 6.9 g/dL (ref 6.0–8.3)
Total Bilirubin: 0.8 mg/dL (ref 0.2–1.2)

## 2014-10-14 NOTE — Patient Instructions (Signed)
Your physician recommends that you continue on your current medications as directed. Please refer to the Current Medication list given to you today.  Your physician recommends that you have lab work:  TODAY - cholesterol, liver, basic metabolic panel  Your physician wants you to follow-up in: 6 months with Dr. Acie Fredrickson.  You will receive a reminder letter in the mail two months in advance. If you don't receive a letter, please call our office to schedule the follow-up appointment.

## 2014-10-14 NOTE — Progress Notes (Signed)
Cardiology Office Note   Date:  10/14/2014   ID:  Dwayne Morris, DOB 1927-01-08, MRN [id number]  PCP:  [name], MD  Cardiologist:   Thayer Headings, MD   Chief Complaint  Patient presents with  . Follow-up    aortic dissection   1. Type III aortic dissection 2. Hypertension 3. Prostate cancer  History of Present Illness:  Dwayne Morris is an 81 year old gentleman originally from [place]. He is done quite well since I last saw him 6 months ago. [name] not had any problems with his blood pressure. [name] been taking his medications the records his blood pressure on a regular basis.  He has a history of an aortic dissection. [name] not had any further episodes of chest pain. We have followed him with CT scans and his most recent CT scans have not shown any worsening of his dissection.  February 24, 2013:  Dwayne Morris is doing well. Still swiimming regularly. No CP , no dyspnea. His CT angio of the aorta which was unremarkable. He has been found to have a left inguinal hernia.   Jan. 12, 2015:  Dwayne Morris is doing well. He has been to [place] since last saw him. Still swimming reguarly.   April 14, 2014:  Doing well. No CP or issues. Still swimming regulalry.  He has noticed that on occasion is lower than 100 - he typically holds his labetalol .  The following morning the blood pressures usually normal.   Feb. 18, 2016:   Dwayne Morris is a 81 y.o. male who presents for follow up of his aortic dissection.  His home BP readings are all good.  He swims 1/2 mile a day 3 days a week.  Walks quite a bit.  Also is active in his gardent  Has some occasional back pain if he works in his garden   Past Medical History  Diagnosis Date  . Hypertension   . Aortic dissection     type B aortic dissection in November 2008  . Prostate cancer     PSA levels have decreased with garlic therapy  . Stroke 1996    History of right vertebral artery dissection with stroke  . Thoracic aortic aneurysm     Past  Surgical History  Procedure Laterality Date  . Colonoscopy  09/14/2011    Procedure: COLONOSCOPY;  Surgeon: Beryle Beams, MD;  Location: [facility] ENDOSCOPY;  Service: Endoscopy;  Laterality: N/A;     Current Outpatient Prescriptions  Medication Sig Dispense Refill  . ascorbic acid (VITAMIN C) 1000 MG tablet Take 1,000 mg by mouth daily.    Marland Kitchen aspirin 81 MG tablet Take 81 mg by mouth daily.      Marland Kitchen atorvastatin (LIPITOR) 20 MG tablet TAKE 1 TABLET (20 MG TOTAL) BY MOUTH AT BEDTIME. 30 tablet 11  . Cholecalciferol (VITAMIN D-3 PO) Take by mouth.    . Choline Fenofibrate (FENOFIBRIC ACID) 135 MG CPDR TAKE 1 CAPSULE (135 MG TOTAL) BY MOUTH DAILY. 90 capsule 3  . dutasteride (AVODART) 0.5 MG capsule Take 0.5 mg by mouth daily.      Marland Kitchen esomeprazole (NEXIUM) 40 MG capsule Take 40 mg by mouth daily before breakfast.      . fish oil-omega-3 fatty acids 1000 MG capsule Take 1 g by mouth as needed.     . labetalol (NORMODYNE) 100 MG tablet TAKE 1 TABLET (100 MG TOTAL) BY MOUTH 2 (TWO) TIMES DAILY. 60 tablet 0  . latanoprost (XALATAN) 0.005 % ophthalmic solution  Place 1 drop into both eyes at bedtime.     . Multiple Vitamin (MULITIVITAMIN WITH MINERALS) TABS Take 1 tablet by mouth daily.    Marland Kitchen VITAMIN E PO Take 400 mg by mouth every other day.      No current facility-administered medications for this visit.    Allergies:   Review of patient's allergies indicates no known allergies.    Social History:  The patient  reports that he quit smoking about 40 years ago. His smoking use included Cigarettes. He has a 30 pack-year smoking history. He does not have any smokeless tobacco history on file. He reports that he does not drink alcohol or use illicit drugs.   Family History:  The patient's family history is not on file.    ROS:  Please see the history of present illness.    Review of Systems: Constitutional:  denies fever, chills, diaphoresis, appetite change and fatigue.  HEENT: denies photophobia,  eye pain, redness, hearing loss, ear pain, congestion, sore throat, rhinorrhea, sneezing, neck pain, neck stiffness and tinnitus.  Respiratory: denies SOB, DOE, cough, chest tightness, and wheezing.  Cardiovascular: denies chest pain, palpitations and leg swelling.  Gastrointestinal: denies nausea, vomiting, abdominal pain, diarrhea, constipation, blood in stool.  Genitourinary: denies dysuria, urgency, frequency, hematuria, flank pain and difficulty urinating.  Musculoskeletal: denies  myalgias, back pain, joint swelling, arthralgias and gait problem.   Skin: denies pallor, rash and wound.  Neurological: denies dizziness, seizures, syncope, weakness, light-headedness, numbness and headaches.   Hematological: denies adenopathy, easy bruising, personal or family bleeding history.  Psychiatric/ Behavioral: denies suicidal ideation, mood changes, confusion, nervousness, sleep disturbance and agitation.       All other systems are reviewed and negative.    PHYSICAL EXAM: VS:  BP 112/80 mmHg  Pulse 64  Ht 5\' 3"  (1.6 m)  Wt 140 lb (63.504 kg)  BMI 24.81 kg/m2  SpO2 96% , BMI Body mass index is 24.81 kg/(m^2). GEN: Well nourished, well developed, in no acute distress HEENT: normal Neck: no JVD, carotid bruits, or masses Cardiac: RRR; no murmurs, rubs, or gallops,no edema  Respiratory:  clear to auscultation bilaterally, normal work of breathing GI: soft, nontender, nondistended, + BS MS: no deformity or atrophy Skin: warm and dry, no rash Neuro:  Strength and sensation are intact Psych: normal   EKG:  EKG is not ordered today.    Recent Labs: 12/10/2013: BUN 19; Creatinine 0.98    Lipid Panel    Component Value Date/Time   CHOL 135 09/07/2013 1641   TRIG 33.0 09/07/2013 1641   HDL 61.60 09/07/2013 1641   CHOLHDL 2 09/07/2013 1641   VLDL 6.6 09/07/2013 1641   LDLCALC 67 09/07/2013 1641      Wt Readings from Last 3 Encounters:  10/14/14 140 lb (63.504 kg)  04/14/14 138  lb (62.596 kg)  12/14/13 132 lb (59.875 kg)      Other studies Reviewed: Additional studies/ records that were reviewed today include: . Review of the above records demonstrates:    ASSESSMENT AND PLAN:  1. Type III aortic dissection - he's doing very well. He has not had any expansion or extension of his dissection. He has been seeing Dr. Roxy Manns on a yearly basis and we'll see him again in 2 years. His blood pressure control is very tight. He continues same medications.  2. Hypertension - he keeps meticulous logs of his blood pressure readings. All his readings are in the normal range.  3. Prostate cancer -  he follows up with Dr. Jeffie Pollock on a regular basis.   Current medicines are reviewed at length with the patient today.  The patient does not have concerns regarding medicines.  The following changes have been made:  no change   Disposition:   FU with me in 6 months .     Signed, Nahser, Wonda Cheng, MD  10/14/2014 2:46 PM    [place], [place], [place]  [address] Phone: [phone]; Fax: [phone]

## 2014-10-16 ENCOUNTER — Other Ambulatory Visit: Payer: Self-pay | Admitting: Cardiovascular Disease

## 2014-10-19 ENCOUNTER — Other Ambulatory Visit: Payer: Self-pay

## 2014-10-19 ENCOUNTER — Other Ambulatory Visit: Payer: Self-pay | Admitting: Cardiovascular Disease

## 2014-10-20 ENCOUNTER — Encounter (HOSPITAL_COMMUNITY): Payer: Self-pay | Admitting: *Deleted

## 2014-10-29 ENCOUNTER — Ambulatory Visit (HOSPITAL_COMMUNITY): Admission: RE | Admit: 2014-10-29 | Payer: Medicare PPO | Source: Ambulatory Visit | Admitting: Gastroenterology

## 2014-10-29 SURGERY — COLONOSCOPY WITH PROPOFOL
Anesthesia: Monitor Anesthesia Care

## 2014-11-15 DIAGNOSIS — H43813 Vitreous degeneration, bilateral: Secondary | ICD-10-CM | POA: Diagnosis not present

## 2014-11-15 DIAGNOSIS — H35373 Puckering of macula, bilateral: Secondary | ICD-10-CM | POA: Diagnosis not present

## 2014-11-15 DIAGNOSIS — Z961 Presence of intraocular lens: Secondary | ICD-10-CM | POA: Diagnosis not present

## 2014-11-15 DIAGNOSIS — H4011X2 Primary open-angle glaucoma, moderate stage: Secondary | ICD-10-CM | POA: Diagnosis not present

## 2015-01-03 DIAGNOSIS — R5383 Other fatigue: Secondary | ICD-10-CM | POA: Diagnosis not present

## 2015-01-03 DIAGNOSIS — Z79899 Other long term (current) drug therapy: Secondary | ICD-10-CM | POA: Diagnosis not present

## 2015-01-03 DIAGNOSIS — D649 Anemia, unspecified: Secondary | ICD-10-CM | POA: Diagnosis not present

## 2015-01-05 ENCOUNTER — Encounter: Payer: Self-pay | Admitting: Cardiovascular Disease

## 2015-01-06 ENCOUNTER — Other Ambulatory Visit: Payer: Self-pay | Admitting: Internal Medicine

## 2015-01-06 DIAGNOSIS — G3184 Mild cognitive impairment, so stated: Secondary | ICD-10-CM

## 2015-01-17 ENCOUNTER — Other Ambulatory Visit: Payer: Medicare PPO

## 2015-03-18 ENCOUNTER — Other Ambulatory Visit: Payer: Self-pay | Admitting: Cardiovascular Disease

## 2015-05-03 ENCOUNTER — Telehealth: Payer: Self-pay | Admitting: *Deleted

## 2015-05-03 DIAGNOSIS — Z23 Encounter for immunization: Secondary | ICD-10-CM | POA: Diagnosis not present

## 2015-05-03 NOTE — Telephone Encounter (Signed)
called for fam med hx, no answer

## 2015-05-05 ENCOUNTER — Ambulatory Visit (INDEPENDENT_AMBULATORY_CARE_PROVIDER_SITE_OTHER): Payer: Medicare PPO | Admitting: Cardiovascular Disease

## 2015-05-05 ENCOUNTER — Encounter: Payer: Self-pay | Admitting: Cardiovascular Disease

## 2015-05-05 VITALS — BP 112/58 | HR 60 | Ht 63.0 in | Wt 140.0 lb

## 2015-05-05 DIAGNOSIS — I712 Thoracic aortic aneurysm, without rupture, unspecified: Secondary | ICD-10-CM

## 2015-05-05 NOTE — Patient Instructions (Addendum)

## 2015-05-05 NOTE — Progress Notes (Signed)
Cardiology Office Note   Date:  05/05/2015   ID:  NADIM MALIA, DOB 09-21-1926, MRN [id number]  PCP:  [name], MD  Cardiologist:   Thayer Headings, MD   Chief Complaint  Patient presents with  . Follow-up    aortic dissection    1. Type III aortic dissection 2. Hypertension 3. Prostate cancer 4. 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 He is done quite well since I last saw him 6 months ago. [name] not had any problems with his blood pressure. [name] been taking his medications the records his blood pressure on a regular basis.  He has a history of an aortic dissection. [name] not had any further episodes of chest pain. We have followed him with CT scans and his most recent CT scans have not shown any worsening of his dissection.  February 24, 2013:  DK is doing well. Still swiimming regularly. No CP , no dyspnea. His CT angio of the aorta which was unremarkable. He has been found to have a left inguinal hernia.   Jan. 12, 2015:  DK is doing well. He has been to [place] since last saw him. Still swimming reguarly.   April 14, 2014:  Doing well. No CP or issues. Still swimming regulalry.  He has noticed that on occasion is lower than 100 - he typically holds his labetalol .  The following morning the blood pressures usually normal.   Feb. 18, 2016:   SKILER OLDEN is a 81 y.o. male who presents for follow up of his aortic dissection.  His home BP readings are all good.  He swims 1/2 mile a day 3 days a week.  Walks quite a bit.  Also is active in his gardent  Has some occasional back pain if he works in his garden  Sept. 8, 2016:  Doing well.  Has some "stuffiness in his chest "  ,  Seems to be more pronounced if he is stress.  Goes away when he walks around the block   We discussed his vertebral artery dissection that occurred 20 years ago .  was  hospitalized . Still has some left sided weakness adn "stiffness"   His son , [name], noticed that he occasionally has some facial drooping . He has discussed with Dr. Maudie Mercury.   Dr. Maudie Mercury suggest   Brought his BP log.  All his readings are norma.    Past Medical History  Diagnosis Date  . Hypertension   . Aortic dissection     type B aortic dissection in November 2008  . Prostate cancer     PSA levels have decreased with garlic therapy  . Stroke 1996    History of right vertebral artery dissection with stroke  . Thoracic aortic aneurysm     Past Surgical History  Procedure Laterality Date  . Colonoscopy  09/14/2011    Procedure: COLONOSCOPY;  Surgeon: Beryle Beams, MD;  Location: [facility] ENDOSCOPY;  Service: Endoscopy;  Laterality: N/A;     Current Outpatient Prescriptions  Medication Sig Dispense Refill  . ascorbic acid (VITAMIN C) 1000 MG tablet Take 1,000 mg by mouth daily.    Marland Kitchen aspirin 81 MG tablet Take 81 mg by mouth daily.      Marland Kitchen atorvastatin (LIPITOR) 20 MG tablet TAKE 1 TABLET (20 MG TOTAL) BY MOUTH AT BEDTIME. 30 tablet 11  .  Cholecalciferol (VITAMIN D-3 PO) Take 5,000 mg by mouth daily.     . Choline Fenofibrate (FENOFIBRIC ACID) 135 MG CPDR TAKE 1 CAPSULE (135 MG TOTAL) BY MOUTH DAILY. 90 capsule 2  . dutasteride (AVODART) 0.5 MG capsule Take 0.5 mg by mouth daily.      Marland Kitchen esomeprazole (NEXIUM) 40 MG capsule Take 40 mg by mouth daily before breakfast.      . fish oil-omega-3 fatty acids 1000 MG capsule Take 1 g by mouth daily.     Marland Kitchen labetalol (NORMODYNE) 100 MG tablet TAKE 1 TABLET (100 MG TOTAL) BY MOUTH 2 (TWO) TIMES DAILY. 60 tablet 6  . latanoprost (XALATAN) 0.005 % ophthalmic solution Place 1 drop into both eyes at bedtime.     . Multiple Vitamin (MULITIVITAMIN WITH MINERALS) TABS Take 1 tablet by mouth daily.    Marland Kitchen VITAMIN E PO Take 400 mg by mouth every other day.      No current facility-administered medications for this visit.    Allergies:   Review of patient's  allergies indicates no known allergies.    Social History:  The patient  reports that he quit smoking about 41 years ago. His smoking use included Cigarettes. He has a 30 pack-year smoking history. He does not have any smokeless tobacco history on file. He reports that he does not drink alcohol or use illicit drugs.   Family History:  The patient's family history is not on file.    ROS:  Please see the history of present illness.    Review of Systems: Constitutional:  denies fever, chills, diaphoresis, appetite change and fatigue.  HEENT: denies photophobia, eye pain, redness, hearing loss, ear pain, congestion, sore throat, rhinorrhea, sneezing, neck pain, neck stiffness and tinnitus.  Respiratory: denies SOB, DOE, cough, chest tightness, and wheezing.  Cardiovascular: denies chest pain, palpitations and leg swelling.  Gastrointestinal: denies nausea, vomiting, abdominal pain, diarrhea, constipation, blood in stool.  Genitourinary: denies dysuria, urgency, frequency, hematuria, flank pain and difficulty urinating.  Musculoskeletal: denies  myalgias, back pain, joint swelling, arthralgias and gait problem.   Skin: denies pallor, rash and wound.  Neurological: denies dizziness, seizures, syncope, weakness, light-headedness, numbness and headaches.   Hematological: denies adenopathy, easy bruising, personal or family bleeding history.  Psychiatric/ Behavioral: denies suicidal ideation, mood changes, confusion, nervousness, sleep disturbance and agitation.       All other systems are reviewed and negative.    PHYSICAL EXAM: VS:  BP 112/58 mmHg  Pulse 60  Ht 5\' 3"  (1.6 m)  Wt 63.504 kg (140 lb)  BMI 24.81 kg/m2 , BMI Body mass index is 24.81 kg/(m^2). GEN: Well nourished, well developed, in no acute distress HEENT: normal Neck: no JVD, carotid bruits, or masses Cardiac: RRR; no murmurs, rubs, or gallops,no edema  Respiratory:  clear to auscultation bilaterally, normal work of  breathing GI: soft, nontender, nondistended, + BS MS: no deformity or atrophy Skin: warm and dry, no rash Neuro:  Strength and sensation are intact Psych: normal   EKG:  EKG is ordered today. Sept. 8, 2016:  NSR at 60 . Normal ecg   Recent Labs: 10/14/2014: ALT 19; BUN 30*; Creatinine, Ser 0.90; Potassium 4.2; Sodium 136    Lipid Panel    Component Value Date/Time   CHOL 122 10/14/2014 1513   TRIG 110.0 10/14/2014 1513   HDL 51.60 10/14/2014 1513   CHOLHDL 2 10/14/2014 1513   VLDL 22.0 10/14/2014 1513   LDLCALC 48 10/14/2014 1513  Wt Readings from Last 3 Encounters:  05/05/15 63.504 kg (140 lb)  10/14/14 63.504 kg (140 lb)  04/14/14 62.596 kg (138 lb)      Other studies Reviewed: Additional studies/ records that were reviewed today include: . Review of the above records demonstrates:    ASSESSMENT AND PLAN:  1. Type III aortic dissection - he's doing very well. He has not had any expansion or extension of his dissection. He has been seeing Dr. Roxy Manns on a yearly basis and we'll see him again in 2 years. His blood pressure control is very tight. He continues same medications. The patient has also had a dissection of the right vertebral artery .   He had a brother that had a cerebral aneurism .  He may have an underlying condition that causes his arterial walls to be fragile. He has some residual left sided deficits from his previous stroke and would like to have a MRA of the head to rule out cerebral aneurism.  I think this is a reasonable study to perform.  Hey may need to see neuro ,.    2. Hypertension - he keeps meticulous logs of his blood pressure readings. All his readings are in the normal range.  3. Prostate cancer - he follows up with Dr. Jeffie Pollock on a regular basis.   Current medicines are reviewed at length with the patient today.  The patient does not have concerns regarding medicines.  The following changes have been made:  no change   Disposition:    FU with me in 6 months .     Signed, Bexley Laubach, Wonda Cheng, MD  05/05/2015 3:39 PM    [address], [place], [place]  [address] Phone: [phone]; Fax: [phone]

## 2015-05-23 DIAGNOSIS — C61 Malignant neoplasm of prostate: Secondary | ICD-10-CM | POA: Diagnosis not present

## 2015-05-31 DIAGNOSIS — H401132 Primary open-angle glaucoma, bilateral, moderate stage: Secondary | ICD-10-CM | POA: Diagnosis not present

## 2015-06-01 DIAGNOSIS — N138 Other obstructive and reflux uropathy: Secondary | ICD-10-CM | POA: Diagnosis not present

## 2015-06-01 DIAGNOSIS — R339 Retention of urine, unspecified: Secondary | ICD-10-CM | POA: Diagnosis not present

## 2015-06-01 DIAGNOSIS — K4090 Unilateral inguinal hernia, without obstruction or gangrene, not specified as recurrent: Secondary | ICD-10-CM | POA: Diagnosis not present

## 2015-06-01 DIAGNOSIS — C61 Malignant neoplasm of prostate: Secondary | ICD-10-CM | POA: Diagnosis not present

## 2015-06-01 DIAGNOSIS — N401 Enlarged prostate with lower urinary tract symptoms: Secondary | ICD-10-CM | POA: Diagnosis not present

## 2015-06-06 ENCOUNTER — Other Ambulatory Visit: Payer: Self-pay | Admitting: Gastroenterology

## 2015-06-17 ENCOUNTER — Other Ambulatory Visit: Payer: Self-pay | Admitting: Cardiovascular Disease

## 2015-06-17 ENCOUNTER — Telehealth: Payer: Self-pay | Admitting: Cardiovascular Disease

## 2015-06-17 NOTE — Telephone Encounter (Signed)
Left message for patient to call back  

## 2015-06-17 NOTE — Telephone Encounter (Signed)
New message      Pt wishes to discuss his upcoming colonscopy with Dr Acie Fredrickson

## 2015-06-20 NOTE — Telephone Encounter (Signed)
Spoke with patient who states he is having colonoscopy on Nov. 4 with Dr. Benson Norway and he is concerned about taking ASA.  I advised patient that per Dr. Acie Fredrickson, who is in the office, he can hold ASA 5 days prior to colonoscopy and restart 3 days after.  Patient states he had bleeding after last colonoscopy.   Patient goes on to describe left chest pain that is intermittent and does not radiate.  He states pain sometime gets better with exercise and with rest.  He denies SOB, n/v, diaphoresis.  He reports the pain does not feel like his previous dissection.  He states that pain was sharp and radiated into his back.  He states this pain does not radiate into his back.  Dr. Acie Fredrickson advised patient can come in to see him tomorrow.  He is scheduled for 0930 tomorrow and I advised him to go to the ER if pain intensifies prior to appointment tomorrow.  He verbalized understanding and agreement.

## 2015-06-21 ENCOUNTER — Ambulatory Visit (INDEPENDENT_AMBULATORY_CARE_PROVIDER_SITE_OTHER): Payer: Medicare PPO | Admitting: Cardiovascular Disease

## 2015-06-21 ENCOUNTER — Telehealth (HOSPITAL_COMMUNITY): Payer: Self-pay | Admitting: *Deleted

## 2015-06-21 ENCOUNTER — Encounter: Payer: Self-pay | Admitting: Cardiovascular Disease

## 2015-06-21 VITALS — BP 110/56 | HR 61 | Ht 65.0 in | Wt 139.0 lb

## 2015-06-21 DIAGNOSIS — I712 Thoracic aortic aneurysm, without rupture, unspecified: Secondary | ICD-10-CM

## 2015-06-21 DIAGNOSIS — I7100 Dissection of unspecified site of aorta: Secondary | ICD-10-CM

## 2015-06-21 DIAGNOSIS — R0789 Other chest pain: Secondary | ICD-10-CM | POA: Diagnosis not present

## 2015-06-21 DIAGNOSIS — I71019 Dissection of thoracic aorta, unspecified: Secondary | ICD-10-CM

## 2015-06-21 DIAGNOSIS — R079 Chest pain, unspecified: Secondary | ICD-10-CM

## 2015-06-21 DIAGNOSIS — I7101 Dissection of thoracic aorta: Secondary | ICD-10-CM

## 2015-06-21 LAB — BASIC METABOLIC PANEL
BUN: 22 mg/dL (ref 7–25)
CHLORIDE: 103 mmol/L (ref 98–110)
CO2: 29 mmol/L (ref 20–31)
CREATININE: 1.01 mg/dL (ref 0.70–1.11)
Calcium: 10 mg/dL (ref 8.6–10.3)
Glucose, Bld: 93 mg/dL (ref 65–99)
Potassium: 3.9 mmol/L (ref 3.5–5.3)
Sodium: 139 mmol/L (ref 135–146)

## 2015-06-21 NOTE — Patient Instructions (Addendum)
Medication Instructions:  Your physician recommends that you continue on your current medications as directed. Please refer to the Current Medication list given to you today.   Labwork: NONE ORDER TODAY    Testing/Procedures:     CT ANGIO OF THE CHEST FOR VIEWING OF THE AORTA     Your physician has requested that you have a lexiscan myoview. For further information please visit [email or web address]. Please follow instruction sheet, as given.     Follow-Up:   IN 3 MONTHS WITH DR Acie Fredrickson   Any Other Special Instructions Will Be Listed Below (If Applicable).

## 2015-06-21 NOTE — Telephone Encounter (Signed)
Patient given detailed instructions per Myocardial Perfusion Study Information Sheet for the test on 06/23/15 at 1000. Patient notified to arrive 15 minutes early and that it is imperative to arrive on time for appointment to keep from having the test rescheduled.  If you need to cancel or reschedule your appointment, please call the office within 24 hours of your appointment. Failure to do so may result in a cancellation of your appointment, and a $50 no show fee. Patient verbalized understanding.Jemya Depierro, Ranae Palms

## 2015-06-21 NOTE — Addendum Note (Signed)
Addended by: Claude Manges on: 06/21/2015 11:46 AM   Modules accepted: Orders

## 2015-06-21 NOTE — Progress Notes (Signed)
Cardiology Office Note   Date:  06/21/2015   ID:  NIRAJ KUDRNA, DOB 05/02/1927, MRN [id number]  PCP:  [name], MD  Cardiologist:   Thayer Headings, MD   Chief Complaint  Patient presents with  . Chest Pain   1. Type III aortic dissection 2. Hypertension 3. Prostate cancer 4. 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 He is done quite well since I last saw him 6 months ago. [name] not had any problems with his blood pressure. [name] been taking his medications the records his blood pressure on a regular basis.  He has a history of an aortic dissection. [name] not had any further episodes of chest pain. We have followed him with CT scans and his most recent CT scans have not shown any worsening of his dissection.  February 24, 2013:  Dwayne Morris is doing well. Still swiimming regularly. No CP , no dyspnea. His CT angio of the aorta which was unremarkable. He has been found to have a left inguinal hernia.   Jan. 12, 2015:  Dwayne Morris is doing well. He has been to [place] since last saw him. Still swimming reguarly.   April 14, 2014:  Doing well. No CP or issues. Still swimming regulalry.  He has noticed that on occasion is lower than 100 - he typically holds his labetalol .  The following morning the blood pressures usually normal.   Feb. 18, 2016:   Dwayne Morris is a 81 y.o. male who presents for follow up of his aortic dissection.  His home BP readings are all good.  He swims 1/2 mile a day 3 days a week.  Walks quite a bit.  Also is active in his gardent  Has some occasional back pain if he works in his garden  Sept. 8, 2016:  Doing well.  Has some "stuffiness in his chest "  ,  Seems to be more pronounced if he is stress.  Goes away when he walks around the block   We discussed his vertebral artery dissection that occurred 20 years ago .  was hospitalized . Still has some  left sided weakness adn "stiffness"   His son , [name], noticed that he occasionally has some facial drooping . He has discussed with Dr. Maudie Mercury.   Dr. Maudie Mercury suggest   Brought his BP log.  All his readings are normal  Oct. 25, 2016:  Dwayne Morris is seen today for some chest discomfort. Chest pressure , minor Feels better after swimming or walking . Not worsened with deep breath ( in fact, makes it feel better ) Not worsened with arm movement  calastinics seem to help   Mostly on the left side.  Not associated with sweats or dyspnea.  Is scheduled to have colonoscopy on Nov. 4 ( Dr. Benson Norway)  [facility]cedure [facility] to hold his ASA for a week prior to colonoscopy . Has had polys removed in the past - complicated by a lower GI bleed. Apparently a clip had come loose.     Past Medical History  Diagnosis Date  . Hypertension   . Aortic dissection (H[facility])     type B aortic dissection in November 2008  . Prostate cancer ([place])     PSA levels have decreased with garlic therapy  . Stroke [facility]) 1996    History of right vertebral artery dissection with stroke  . Thoracic aortic  aneurysm [facility])     Past Surgical History  Procedure Laterality Date  . Colonoscopy  09/14/2011    Procedure: COLONOSCOPY;  Surgeon: Beryle Beams, MD;  Location: [facility] ENDOSCOPY;  Service: Endoscopy;  Laterality: N/A;     Current Outpatient Prescriptions  Medication Sig Dispense Refill  . ascorbic acid (VITAMIN C) 1000 MG tablet Take 1,000 mg by mouth 2 (two) times daily.     Marland Kitchen aspirin 81 MG tablet Take 81 mg by mouth at bedtime.     Marland Kitchen atorvastatin (LIPITOR) 20 MG tablet TAKE 1 TABLET (20 MG TOTAL) BY MOUTH AT BEDTIME. 30 tablet 11  . Cholecalciferol (VITAMIN D-3 PO) Take 5,000 mg by mouth daily.     . Choline Fenofibrate (FENOFIBRIC ACID) 135 MG CPDR TAKE 1 CAPSULE (135 MG TOTAL) BY MOUTH DAILY. 90 capsule 2  . finasteride (PROSCAR) 5 MG tablet Take 5 mg by mouth daily.    . fish oil-omega-3 fatty acids 1000 MG capsule Take 1,000 mg by mouth  daily.     Marland Kitchen labetalol (NORMODYNE) 100 MG tablet TAKE 1 TABLET (100 MG TOTAL) BY MOUTH 2 (TWO) TIMES DAILY. 60 tablet 6  . latanoprost (XALATAN) 0.005 % ophthalmic solution Place 1 drop into both eyes at bedtime.     . Multiple Vitamin (MULITIVITAMIN WITH MINERALS) TABS Take 1 tablet by mouth daily.    . polyvinyl alcohol (LIQUIFILM TEARS) 1.4 % ophthalmic solution Place 1 drop into both eyes every 8 (eight) hours as needed for dry eyes.    Marland Kitchen VITAMIN E PO Take 1,000 Units by mouth daily.     Marland Kitchen esomeprazole (NEXIUM) 40 MG capsule Take 40 mg by mouth daily before breakfast.       No current facility-administered medications for this visit.    Allergies:   Review of patient's allergies indicates no known allergies.    Social History:  The patient  reports that he quit smoking about 41 years ago. His smoking use included Cigarettes. He has a 30 pack-year smoking history. He does not have any smokeless tobacco history on file. He reports that he does not drink alcohol or use illicit drugs.   Family History:  The patient's family history is not on file.    ROS:  Please see the history of present illness.    Review of Systems: Constitutional:  denies fever, chills, diaphoresis, appetite change and fatigue.  HEENT: denies photophobia, eye pain, redness, hearing loss, ear pain, congestion, sore throat, rhinorrhea, sneezing, neck pain, neck stiffness and tinnitus.  Respiratory: denies SOB, DOE, cough, chest tightness, and wheezing.  Cardiovascular: denies chest pain, palpitations and leg swelling.  Gastrointestinal: denies nausea, vomiting, abdominal pain, diarrhea, constipation, blood in stool.  Genitourinary: denies dysuria, urgency, frequency, hematuria, flank pain and difficulty urinating.  Musculoskeletal: denies  myalgias, back pain, joint swelling, arthralgias and gait problem.   Skin: denies pallor, rash and wound.  Neurological: denies dizziness, seizures, syncope, weakness,  light-headedness, numbness and headaches.   Hematological: denies adenopathy, easy bruising, personal or family bleeding history.  Psychiatric/ Behavioral: denies suicidal ideation, mood changes, confusion, nervousness, sleep disturbance and agitation.       All other systems are reviewed and negative.    PHYSICAL EXAM: VS:  BP 110/56 mmHg  Pulse 61  Ht 5\' 5"  (1.651 m)  Wt 139 lb (63.05 kg)  BMI 23.13 kg/m2 , BMI Body mass index is 23.13 kg/(m^2). GEN: Well nourished, well developed, in no acute distress HEENT: normal Neck: no JVD, carotid  bruits, or masses Cardiac: RRR; no murmurs, rubs, or gallops,no edema  Respiratory:  clear to auscultation bilaterally, normal work of breathing GI: soft, nontender, nondistended, + BS MS: no deformity or atrophy Skin: warm and dry, no rash Neuro:  Strength and sensation are intact Psych: normal   EKG:  EKG is ordered today. Sept. 8, 2016:  NSR at 60 . Normal ecg   Recent Labs: 10/14/2014: ALT 19; BUN 30*; Creatinine, Ser 0.90; Potassium 4.2; Sodium 136    Lipid Panel    Component Value Date/Time   CHOL 122 10/14/2014 1513   TRIG 110.0 10/14/2014 1513   HDL 51.60 10/14/2014 1513   CHOLHDL 2 10/14/2014 1513   VLDL 22.0 10/14/2014 1513   LDLCALC 48 10/14/2014 1513      Wt Readings from Last 3 Encounters:  06/21/15 139 lb (63.05 kg)  05/05/15 140 lb (63.504 kg)  10/14/14 140 lb (63.504 kg)      Other studies Reviewed: Additional studies/ records that were reviewed today include: . Review of the above records demonstrates:   ECG :  Sinus brady . Otherwise normal ECG   ASSESSMENT AND PLAN:  1. Type III aortic dissection - he's doing very well. He has not had any expansion or extension of his dissection. He has been seeing Dr. Roxy Manns on a yearly basis and we'll see him again in 2 years. His blood pressure control is very tight. He continues same medications. He now presents with some episodes of chest discomfort. These  episodes are an achy like sensation. I think it will be important to do a CT and gram of his chest to make sure that his dissection or his aneurysm are  not enlarging.  We will also do a [place] study to evaluate him for the possibility of coronary artery disease.   2. Hypertension - he keeps meticulous logs of his blood pressure readings. All his readings are in the normal range.  3. Prostate cancer - he follows up with Dr. Jeffie Pollock on a regular basis.  Current medicines are reviewed at length with the patient today.  The patient does not have concerns regarding medicines.  The following changes have been made:  no change   Disposition:   FU with me in 3 months .     Signed, Nahser, Wonda Cheng, MD  06/21/2015 10:27 AM    [address] [place], [place], [place]  [address] Phone: [phone]; Fax: [phone]

## 2015-06-23 ENCOUNTER — Ambulatory Visit (HOSPITAL_COMMUNITY): Payer: Medicare PPO | Attending: Cardiovascular Disease

## 2015-06-23 DIAGNOSIS — I10 Essential (primary) hypertension: Secondary | ICD-10-CM | POA: Diagnosis not present

## 2015-06-23 DIAGNOSIS — R079 Chest pain, unspecified: Secondary | ICD-10-CM | POA: Diagnosis not present

## 2015-06-23 LAB — MYOCARDIAL PERFUSION IMAGING
CHL CUP NUCLEAR SDS: 1
CHL CUP NUCLEAR SSS: 3
LV dias vol: 102 mL
LVSYSVOL: 44 mL
Peak HR: 87 {beats}/min
RATE: 0.2
Rest HR: 53 {beats}/min
SRS: 2
TID: 1.07

## 2015-06-23 MED ORDER — TECHNETIUM TC 99M SESTAMIBI GENERIC - CARDIOLITE
32.2000 | Freq: Once | INTRAVENOUS | Status: AC | PRN
  Administered 2015-06-23: 32.2 via INTRAVENOUS

## 2015-06-23 MED ORDER — REGADENOSON 0.4 MG/5ML IV SOLN
0.4000 mg | Freq: Once | INTRAVENOUS | Status: AC
  Administered 2015-06-23: 0.4 mg via INTRAVENOUS

## 2015-06-23 MED ORDER — TECHNETIUM TC 99M SESTAMIBI GENERIC - CARDIOLITE
10.9000 | Freq: Once | INTRAVENOUS | Status: AC | PRN
  Administered 2015-06-23: 10.9 via INTRAVENOUS

## 2015-06-27 ENCOUNTER — Encounter (HOSPITAL_COMMUNITY): Payer: Self-pay | Admitting: *Deleted

## 2015-06-28 ENCOUNTER — Other Ambulatory Visit: Payer: Self-pay | Admitting: Cardiovascular Disease

## 2015-06-28 ENCOUNTER — Ambulatory Visit (INDEPENDENT_AMBULATORY_CARE_PROVIDER_SITE_OTHER)
Admission: RE | Admit: 2015-06-28 | Discharge: 2015-06-28 | Disposition: A | Discharge status: Home / Self Care | Payer: Medicare PPO | Source: Ambulatory Visit | Attending: Cardiovascular Disease | Admitting: Cardiovascular Disease

## 2015-06-28 ENCOUNTER — Inpatient Hospital Stay: Admission: RE | Admit: 2015-06-28 | Payer: Medicare PPO | Source: Ambulatory Visit

## 2015-06-28 DIAGNOSIS — I71019 Dissection of thoracic aorta, unspecified: Secondary | ICD-10-CM

## 2015-06-28 DIAGNOSIS — I712 Thoracic aortic aneurysm, without rupture: Secondary | ICD-10-CM | POA: Diagnosis not present

## 2015-06-28 DIAGNOSIS — I7101 Dissection of thoracic aorta: Secondary | ICD-10-CM

## 2015-06-28 MED ORDER — IOHEXOL 350 MG/ML SOLN
100.0000 mL | Freq: Once | INTRAVENOUS | Status: AC | PRN
  Administered 2015-06-28: 100 mL via INTRAVENOUS

## 2015-06-29 NOTE — H&P (Signed)
[name] HPI: On 09/13/2011 the patient was identified to have a large TV adenoma of the cecum.  It was resected and he had a post polypectomy bleed.  This bleeding was arrested the next day with a repeat colonoscopy in the ICU.  Multiple clips were placed and he did not have any sequelae from the procedures.  Since his last visit he denies any changes with his health.  He remains active with walking several miles per day as well as swimming a half mild per day.  Past Medical History  Diagnosis Date  . Hypertension   . Aortic dissection (HCC)     type B aortic dissection in November 2008  . Prostate cancer ([place])     PSA levels have decreased with garlic therapy  . Stroke [facility]) 1996    History of right vertebral artery dissection with stroke  . Thoracic aortic aneurysm [facility])     Past Surgical History  Procedure Laterality Date  . Colonoscopy  09/14/2011    Procedure: COLONOSCOPY;  Surgeon: Beryle Beams, MD;  Location: [facility] ENDOSCOPY;  Service: Endoscopy;  Laterality: N/A;  . Eye surgery      catract  . Arotic discection       2012     History reviewed. No pertinent family history.  Social History:  reports that he quit smoking about 41 years ago. His smoking use included Cigarettes. He has a 30 pack-year smoking history. He does not have any smokeless tobacco history on file. He reports that he does not drink alcohol or use illicit drugs.  Allergies: No Known Allergies  Medications: Scheduled: Continuous:  No results found for this or any previous visit (from the past 24 hour(s)).   Ct Angio Chest Aorta W/cm &/or Wo/cm  06/28/2015  CLINICAL DATA:  81 year old male with ascending aortic aneurysm and 1 month history of left chest pressure EXAM: CT ANGIOGRAPHY CHEST WITH CONTRAST TECHNIQUE: Multidetector CT imaging of the chest was performed using the standard protocol during bolus administration of intravenous contrast. Multiplanar CT image reconstructions and MIPs were obtained  to evaluate the vascular anatomy. CONTRAST:  174mL OMNIPAQUE IOHEXOL 350 MG/ML SOLN COMPARISON:  Prior chest CT 12/14/2013 FINDINGS: Mediastinum: Unremarkable CT appearance of the thyroid gland. No suspicious mediastinal or hilar adenopathy. No soft tissue mediastinal mass. The thoracic esophagus is unremarkable. Heart/Vascular: No evidence of acute intramural hematoma on the initial non contrasted images. Stable aneurysmal dilatation of the aortic root and tubular portion of the ascending thoracic aorta. The aortic root measures 4.6 cm in diameter, similar compared to prior while the tubular portion of the ascending thoracic aorta measures 4.7 cm in diameter which is unchanged. Focal saccular ectasia of the transverse aorta in the region of the infundibulum. The maximal aortic diameter at this level is 4.1 cm, unchanged. No evidence of acute dissection. No effacement of the sino-tubular junction. The descending thoracic aorta is within normal limits. Mild cardiomegaly. Mild atherosclerotic calcifications noted in the coronary arteries. No pericardial effusion. Stable small venous collaterals of uncertain etiology and clinical significance. These may be secondary to mechanical compression of the innominate vein between the sternum anteriorly and the prominent and tortuous great vessels posteriorly. Lungs/Pleura: Dependent atelectasis in both lungs. Several emphysematous blebs are present within the left lower lobe. No suspicious mass or nodule. No pulmonary edema or focal airspace consolidation. Bones/Soft Tissues: No acute fracture or aggressive appearing lytic or blastic osseous lesion. Upper Abdomen: Stable arterial enhancing lesion in the posterior hepatic dome  likely representing a small hemangioma. Periampullary duodenum diverticulum. Otherwise, unremarkable visualized upper abdomen. Review of the MIP images confirms the above findings. IMPRESSION: VASCULAR 1. Stable aneurysmal dilatation of the aortic root  and the tubular portion of the ascending thoracic aorta with a maximal diameter of 4.7 cm. No evidence of aortic dissection or other acute abnormality. 2. Atherosclerotic calcifications including coronary artery calcifications. NON VASCULAR 1. Dependent atelectasis in the bilateral lower lobes. 2. Mild cardiomegaly. 3. Additional ancillary findings as above without significant interval change. Signed, Criselda Peaches, MD [facility] Radiology Electronically Signed   By: Jacqulynn Cadet M.D.   On: 06/28/2015 16:57    ROS:  As stated above in the HPI otherwise negative.  There were no vitals taken for this visit.    PE: Gen: NAD, Alert and Oriented HEENT:  [place]/AT, EOMI Neck: Supple, no LAD Lungs: CTA Bilaterally CV: RRR without M/G/R ABM: Soft, NTND, +BS Ext: No C/C/E  Assessment/Plan: 1) Personal History of Polyps - repeat colonoscopy.  Jennilyn Esteve D 06/29/2015, 7:20 PM

## 2015-06-30 ENCOUNTER — Other Ambulatory Visit: Payer: Medicare PPO

## 2015-07-01 ENCOUNTER — Encounter (HOSPITAL_COMMUNITY)
Admission: RE | Disposition: A | Discharge status: Home / Self Care | Payer: Self-pay | Source: Ambulatory Visit | Attending: Gastroenterology

## 2015-07-01 ENCOUNTER — Ambulatory Visit (HOSPITAL_COMMUNITY): Payer: Medicare PPO | Admitting: Anesthesiology

## 2015-07-01 ENCOUNTER — Ambulatory Visit (HOSPITAL_COMMUNITY)
Admission: RE | Admit: 2015-07-01 | Discharge: 2015-07-01 | Disposition: A | Discharge status: Home / Self Care | Payer: Medicare PPO | Source: Ambulatory Visit | Attending: Gastroenterology | Admitting: Gastroenterology

## 2015-07-01 ENCOUNTER — Encounter (HOSPITAL_COMMUNITY): Payer: Self-pay | Admitting: *Deleted

## 2015-07-01 DIAGNOSIS — K5730 Diverticulosis of large intestine without perforation or abscess without bleeding: Secondary | ICD-10-CM | POA: Diagnosis not present

## 2015-07-01 DIAGNOSIS — Z86010 Personal history of colonic polyps: Secondary | ICD-10-CM | POA: Insufficient documentation

## 2015-07-01 DIAGNOSIS — Z7982 Long term (current) use of aspirin: Secondary | ICD-10-CM | POA: Diagnosis not present

## 2015-07-01 DIAGNOSIS — I10 Essential (primary) hypertension: Secondary | ICD-10-CM | POA: Diagnosis not present

## 2015-07-01 DIAGNOSIS — D120 Benign neoplasm of cecum: Secondary | ICD-10-CM | POA: Insufficient documentation

## 2015-07-01 DIAGNOSIS — K219 Gastro-esophageal reflux disease without esophagitis: Secondary | ICD-10-CM | POA: Diagnosis not present

## 2015-07-01 DIAGNOSIS — Z79899 Other long term (current) drug therapy: Secondary | ICD-10-CM | POA: Diagnosis not present

## 2015-07-01 DIAGNOSIS — Z8546 Personal history of malignant neoplasm of prostate: Secondary | ICD-10-CM | POA: Diagnosis not present

## 2015-07-01 DIAGNOSIS — D125 Benign neoplasm of sigmoid colon: Secondary | ICD-10-CM | POA: Insufficient documentation

## 2015-07-01 DIAGNOSIS — D123 Benign neoplasm of transverse colon: Secondary | ICD-10-CM | POA: Diagnosis not present

## 2015-07-01 DIAGNOSIS — Z8673 Personal history of transient ischemic attack (TIA), and cerebral infarction without residual deficits: Secondary | ICD-10-CM | POA: Diagnosis not present

## 2015-07-01 DIAGNOSIS — Z87891 Personal history of nicotine dependence: Secondary | ICD-10-CM | POA: Diagnosis not present

## 2015-07-01 DIAGNOSIS — I712 Thoracic aortic aneurysm, without rupture: Secondary | ICD-10-CM | POA: Insufficient documentation

## 2015-07-01 DIAGNOSIS — I739 Peripheral vascular disease, unspecified: Secondary | ICD-10-CM | POA: Diagnosis not present

## 2015-07-01 DIAGNOSIS — Z1211 Encounter for screening for malignant neoplasm of colon: Secondary | ICD-10-CM | POA: Diagnosis not present

## 2015-07-01 DIAGNOSIS — Z09 Encounter for follow-up examination after completed treatment for conditions other than malignant neoplasm: Secondary | ICD-10-CM | POA: Diagnosis present

## 2015-07-01 HISTORY — PX: COLONOSCOPY WITH PROPOFOL: SHX5780

## 2015-07-01 SURGERY — COLONOSCOPY WITH PROPOFOL
Anesthesia: Monitor Anesthesia Care

## 2015-07-01 MED ORDER — PROPOFOL 10 MG/ML IV BOLUS
INTRAVENOUS | Status: DC | PRN
  Administered 2015-07-01: 20 mg via INTRAVENOUS
  Administered 2015-07-01: 30 mg via INTRAVENOUS
  Administered 2015-07-01 (×2): 20 mg via INTRAVENOUS

## 2015-07-01 MED ORDER — SODIUM CHLORIDE 0.9 % IV SOLN: INTRAVENOUS | Status: DC

## 2015-07-01 MED ORDER — PROPOFOL 10 MG/ML IV BOLUS
INTRAVENOUS | Status: AC
  Filled 2015-07-01: qty 20

## 2015-07-01 MED ORDER — LACTATED RINGERS IV SOLN
Freq: Once | INTRAVENOUS | Status: AC
  Administered 2015-07-01: 1000 mL via INTRAVENOUS

## 2015-07-01 MED ORDER — PROPOFOL 500 MG/50ML IV EMUL
INTRAVENOUS | Status: DC | PRN
  Administered 2015-07-01: 100 ug/kg/min via INTRAVENOUS

## 2015-07-01 MED ORDER — LACTATED RINGERS IV SOLN
INTRAVENOUS | Status: DC | PRN
  Administered 2015-07-01: 11:00:00 via INTRAVENOUS

## 2015-07-01 SURGICAL SUPPLY — 22 items

## 2015-07-01 NOTE — Interval H&P Note (Signed)
History and Physical Interval Note:  07/01/2015 11:23 AM  Dwayne Morris  has presented today for surgery, with the diagnosis of screening  The various methods of treatment have been discussed with the patient and family. After consideration of risks, benefits and other options for treatment, the patient has consented to  Procedure(s): COLONOSCOPY WITH PROPOFOL (N/A) as a surgical intervention .  The patient's history has been reviewed, patient examined, no change in status, stable for surgery.  I have reviewed the patient's chart and labs.  Questions were answered to the patient's satisfaction.     Florian Chauca D

## 2015-07-01 NOTE — Op Note (Signed)
[facility] [place], [id number]   COLONOSCOPY PROCEDURE REPORT  PATIENT: Dwayne Morris, Dwayne Morris  MR#: [id number] BIRTHDATE: 15-Jan-1927 , 23  yrs. old GENDER: male ENDOSCOPIST: Carol Ada, MD REFERRED BY: PROCEDURE DATE:  July 15, 2015 PROCEDURE:   Colonoscopy with snare polypectomy ASA CLASS:   Class III INDICATIONS: Personal history of polyps MEDICATIONS: Monitored anesthesia care  DESCRIPTION OF PROCEDURE:   After the risks and benefits and of the procedure were explained, informed consent was obtained.  revealed no abnormalities of the rectum.    The Pentax Ped Colon [id number] endoscope was introduced through the anus and advanced to the terminal ileum which was intubated for a short distance .  The quality of the prep was excellent. .  The instrument was then slowly withdrawn as the colon was fully examined. Estimated blood loss is zero unless otherwise noted in this procedure report.   FINDINGS: Cecal intubation was achieved with waterinfusion and minimal CO2 insufflation.  ABM pressure was required to intubate the cecum.  In the cecum the prior polypectomy site was identified. There was a small polypoid lesion (Image 005) that was removed with a cold biopsy forcep.  I was not certain if this represented a residual polyp.  Another cecal polyp measuring 2 mm was removed with a cold biopsy forcep (Image 007).  A 3 mm sessile cecal polyp was removed with a cold snare (Imagbe 008).  This have been a polypoid portion of normal mucosa.  A transverse colon polyp and a sigmoid colon polyp was removed with a cold snare.  scattered sigmoid diverticula were found.  No evidence of any masses, inflammation, or vascular abnormalities.            The scope was then withdrawn from the patient and the procedure completed.  WITHDRAWAL TIME: 9 minutes to cecum and 24 minutes withdrawal.  COMPLICATIONS: There were no immediate complications. ENDOSCOPIC IMPRESSION: 1)  Colonic polyps. 2) Diverticula.  RECOMMENDATIONS: 1) Await biopsy results.  REPEAT EXAM:  cc:  _______________________________ eSignedCarol Ada, MD Jul 15, 2015 12:30 PM   CPT CODES: ICD CODES:  The ICD and CPT codes recommended by this software are interpretations from the data that the clinical staff has captured with the software.  The verification of the translation of this report to the ICD and CPT codes and modifiers is the sole responsibility of the health care institution and practicing physician where this report was generated.  [employer] will not be held responsible for the validity of the ICD and CPT codes included on this report.  AMA assumes no liability for data contained or not contained herein. CPT is a Designer, television/film set of the [employer].   PATIENT NAME:  Dwayne Morris, Dwayne Morris MR#: [id number]

## 2015-07-01 NOTE — Anesthesia Preprocedure Evaluation (Addendum)
Anesthesia Evaluation  Patient identified by MRN, date of birth, ID band Patient awake    Reviewed: Allergy & Precautions, NPO status , Patient's Chart, lab work & pertinent test results, reviewed documented beta blocker date and time   Airway Mallampati: I  TM Distance: >3 FB Neck ROM: Full    Dental  (+) Teeth Intact   Pulmonary former smoker,    breath sounds clear to auscultation       Cardiovascular hypertension, Pt. on medications and Pt. on home beta blockers + Peripheral Vascular Disease   Rhythm:Regular Rate:Normal     Neuro/Psych CVA    GI/Hepatic Neg liver ROS, GERD  ,  Endo/Other  negative endocrine ROS  Renal/GU negative Renal ROS  negative genitourinary   Musculoskeletal negative musculoskeletal ROS (+)   Abdominal   Peds negative pediatric ROS (+)  Hematology negative hematology ROS (+)   Anesthesia Other Findings   Reproductive/Obstetrics negative OB ROS                            Lab Results  Component Value Date   WBC 5.3 07/04/2012   HGB 13.9 07/04/2012   HCT 42.0 07/04/2012   MCV 96.9 07/04/2012   PLT 257.0 07/04/2012   Lab Results  Component Value Date   CREATININE 1.01 06/21/2015   BUN 22 06/21/2015   NA 139 06/21/2015   K 3.9 06/21/2015   CL 103 06/21/2015   CO2 29 06/21/2015   EKG: sinus bradycardia.   Anesthesia Physical Anesthesia Plan  ASA: III  Anesthesia Plan: MAC   Post-op Pain Management:    Induction: Intravenous  Airway Management Planned: Natural Airway and Nasal Cannula  Additional Equipment:   Intra-op Plan:   Post-operative Plan:   Informed Consent: I have reviewed the patients History and Physical, chart, labs and discussed the procedure including the risks, benefits and alternatives for the proposed anesthesia with the patient or authorized representative who has indicated his/her understanding and acceptance.   Dental  advisory given  Plan Discussed with: CRNA  Anesthesia Plan Comments:         Anesthesia Quick Evaluation

## 2015-07-01 NOTE — Discharge Instructions (Signed)

## 2015-07-01 NOTE — Anesthesia Postprocedure Evaluation (Signed)
  Anesthesia Post-op Note  Patient: Dwayne Morris  Procedure(s) Performed: Procedure(s): COLONOSCOPY WITH PROPOFOL (N/A)  Patient Location: Endoscopy Unit  Anesthesia Type:MAC  Level of Consciousness: awake, alert  and oriented  Airway and Oxygen Therapy: Patient Spontanous Breathing  Post-op Pain: none  Post-op Assessment: Post-op Vital signs reviewed              Post-op Vital Signs: Reviewed  Last Vitals:  Filed Vitals:   07/01/15 1230  BP:   Pulse:   Temp: 36.1 C  Resp:     Complications: No apparent anesthesia complications

## 2015-07-01 NOTE — Transfer of Care (Signed)
Immediate Anesthesia Transfer of Care Note  Patient: Dwayne Morris  Procedure(s) Performed: Procedure(s): COLONOSCOPY WITH PROPOFOL (N/A)  Patient Location: PACU and Endoscopy Unit  Anesthesia Type:MAC  Level of Consciousness: sedated and patient cooperative  Airway & Oxygen Therapy: Patient Spontanous Breathing and Patient connected to face mask oxygen  Post-op Assessment: Report given to RN and Post -op Vital signs reviewed and stable  Post vital signs: Reviewed and stable  Last Vitals:  Filed Vitals:   07/01/15 1059  BP: 129/65  Temp: 35.9 C  Resp: 17    Complications: No apparent anesthesia complications

## 2015-07-04 ENCOUNTER — Encounter (HOSPITAL_COMMUNITY): Payer: Self-pay | Admitting: Gastroenterology

## 2015-07-04 ENCOUNTER — Telehealth: Payer: Self-pay | Admitting: Cardiovascular Disease

## 2015-07-04 NOTE — Telephone Encounter (Signed)
Called patient,  No answer, VM didn't pick up.

## 2015-07-04 NOTE — Telephone Encounter (Signed)
F/U  Pt returning RN phone call

## 2015-07-04 NOTE — Telephone Encounter (Signed)
Returned call to patient no answer.LMTC. 

## 2015-07-04 NOTE — Telephone Encounter (Signed)
New problem   Pt returning call concerning his CT scan.

## 2015-07-05 NOTE — Telephone Encounter (Signed)
Dwayne Morris had a repeat CT angio of the chest for an episode of chest pain about a month ago .    He has a small ascending aortic aneurism that has been followed by Dr. Roxy Manns. This aneurism has varied in size slightly over the years  2008 - 4.7 cm 2012 - 4.5 cm 2015 - 4.6 cm 2016 - 4.7 cm   I've reassured him that this appears stable  He may follow up with Dr. Roxy Manns later in the spring.

## 2015-07-05 NOTE — Telephone Encounter (Signed)
Reviewed results of CT angio chest with patient.  I answered his questions but he continued to verbalized anxiety about the results.  Dr. Acie Fredrickson talked with the patient and answered his questions.

## 2015-08-01 ENCOUNTER — Ambulatory Visit (INDEPENDENT_AMBULATORY_CARE_PROVIDER_SITE_OTHER): Payer: Medicare PPO | Admitting: Thoracic Surgery (Cardiothoracic Vascular Surgery)

## 2015-08-01 ENCOUNTER — Encounter: Payer: Self-pay | Admitting: Thoracic Surgery (Cardiothoracic Vascular Surgery)

## 2015-08-01 VITALS — BP 90/52 | HR 64 | Resp 20 | Ht 64.0 in | Wt 132.0 lb

## 2015-08-01 DIAGNOSIS — Z7189 Other specified counseling: Secondary | ICD-10-CM

## 2015-08-01 DIAGNOSIS — I7101 Dissection of thoracic aorta: Secondary | ICD-10-CM | POA: Diagnosis not present

## 2015-08-01 DIAGNOSIS — Z8679 Personal history of other diseases of the circulatory system: Secondary | ICD-10-CM

## 2015-08-01 DIAGNOSIS — I712 Thoracic aortic aneurysm, without rupture, unspecified: Secondary | ICD-10-CM

## 2015-08-01 DIAGNOSIS — I71019 Dissection of thoracic aorta, unspecified: Secondary | ICD-10-CM

## 2015-08-01 DIAGNOSIS — Z712 Person consulting for explanation of examination or test findings: Secondary | ICD-10-CM | POA: Insufficient documentation

## 2015-08-01 NOTE — Progress Notes (Signed)
[address]       [place] [address]             [phone]     CARDIOTHORACIC SURGERY OFFICE NOTE  Referring Provider is NAHSER, Wonda Cheng, MD PCP is [name], MD   HPI:  Patient is an 81 year old male who returns to the office today for follow-up related to aneurysm involving the ascending thoracic aorta.  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  The false lumen of the aortic dissection thrombosed and eventually the descending thoracic aorta healed without leaving any residual chronic dissection. He has known mild aneurysmal enlargement of both the ascending and proximal descending thoracic aorta. He was last seen here in our office on 12/14/2013. Recently he developed some upper back and chest pain after he was blowing leaves in his yard.  He was evaluated by Dr. Acie Fredrickson and underwent follow-up CT angiogram of the chest and nuclear stress test.  He returns to our office today to review the results of his recent CT scan. He states that he has not had any further problems over the past month. Specifically, he has not had any more chest or back pain. He exercises regularly. He swims more than 1/2 mile every other day and on days that he doesn't swim he walks at least 3 miles. He describes no exertional chest discomfort or shortness of breath.   Current Outpatient Prescriptions  Medication Sig Dispense Refill  . ascorbic acid (VITAMIN C) 1000 MG tablet Take 1,000 mg by mouth 2 (two) times daily.     Marland Kitchen aspirin 81 MG tablet Take 81 mg by mouth at bedtime.     Marland Kitchen atorvastatin (LIPITOR) 20 MG tablet TAKE 1 TABLET (20 MG TOTAL) BY MOUTH AT BEDTIME. 30 tablet 11  . Cholecalciferol (VITAMIN D-3 PO) Take 5,000 mg by mouth daily.     . Choline Fenofibrate (FENOFIBRIC ACID) 135 MG CPDR TAKE 1 CAPSULE (135 MG TOTAL) BY MOUTH DAILY. 90 capsule 2  . ciclopirox (PENLAC) 8 % solution     . esomeprazole (NEXIUM) 40 MG capsule Take 40 mg by  mouth daily before breakfast.      . finasteride (PROSCAR) 5 MG tablet Take 5 mg by mouth daily.    . fish oil-omega-3 fatty acids 1000 MG capsule Take 1,000 mg by mouth daily.     Marland Kitchen labetalol (NORMODYNE) 100 MG tablet TAKE 1 TABLET (100 MG TOTAL) BY MOUTH 2 (TWO) TIMES DAILY. 60 tablet 6  . latanoprost (XALATAN) 0.005 % ophthalmic solution Place 1 drop into both eyes at bedtime.     . Multiple Vitamin (MULITIVITAMIN WITH MINERALS) TABS Take 1 tablet by mouth daily.    . polyvinyl alcohol (LIQUIFILM TEARS) 1.4 % ophthalmic solution Place 1 drop into both eyes every 8 (eight) hours as needed for dry eyes.    Marland Kitchen VAYACOG 100-19.5-6.5 MG CAPS Take 1 capsule by mouth daily.  12  . VITAMIN E PO Take 1,000 Units by mouth daily.     . VOLTAREN 1 % GEL      No current facility-administered medications for this visit.      Physical Exam:   BP 90/52 mmHg  Pulse 64  Resp 20  Ht 5\' 4"  (1.626 m)  Wt 132 lb (59.875 kg)  BMI 22.65 kg/m2  SpO2 96%  General:  Well-appearing  Chest:   Clear to auscultation  CV:   Regular rate and  rhythm without murmur  Incisions:  n/a  Abdomen:  Soft and nontender  Extremities:  Warm and well perfused  Diagnostic Tests:  CT ANGIOGRAPHY CHEST WITH CONTRAST  TECHNIQUE: Multidetector CT imaging of the chest was performed using the standard protocol during bolus administration of intravenous contrast. Multiplanar CT image reconstructions and MIPs were obtained to evaluate the vascular anatomy.  CONTRAST: 127mL OMNIPAQUE IOHEXOL 350 MG/ML SOLN  COMPARISON: Prior chest CT 12/14/2013  FINDINGS: Mediastinum: Unremarkable CT appearance of the thyroid gland. No suspicious mediastinal or hilar adenopathy. No soft tissue mediastinal mass. The thoracic esophagus is unremarkable.  Heart/Vascular: No evidence of acute intramural hematoma on the initial non contrasted images. Stable aneurysmal dilatation of the aortic root and tubular portion of the ascending  thoracic aorta. The aortic root measures 4.6 cm in diameter, similar compared to prior while the tubular portion of the ascending thoracic aorta measures 4.7 cm in diameter which is unchanged. Focal saccular ectasia of the transverse aorta in the region of the infundibulum. The maximal aortic diameter at this level is 4.1 cm, unchanged. No evidence of acute dissection. No effacement of the sino-tubular junction. The descending thoracic aorta is within normal limits. Mild cardiomegaly. Mild atherosclerotic calcifications noted in the coronary arteries. No pericardial effusion. Stable small venous collaterals of uncertain etiology and clinical significance. These may be secondary to mechanical compression of the innominate vein between the sternum anteriorly and the prominent and tortuous great vessels posteriorly.  Lungs/Pleura: Dependent atelectasis in both lungs. Several emphysematous blebs are present within the left lower lobe. No suspicious mass or nodule. No pulmonary edema or focal airspace consolidation.  Bones/Soft Tissues: No acute fracture or aggressive appearing lytic or blastic osseous lesion.  Upper Abdomen: Stable arterial enhancing lesion in the posterior hepatic dome likely representing a small hemangioma. Periampullary duodenum diverticulum. Otherwise, unremarkable visualized upper abdomen.  Review of the MIP images confirms the above findings.  IMPRESSION: VASCULAR  1. Stable aneurysmal dilatation of the aortic root and the tubular portion of the ascending thoracic aorta with a maximal diameter of 4.7 cm. No evidence of aortic dissection or other acute abnormality. 2. Atherosclerotic calcifications including coronary artery calcifications. NON VASCULAR  1. Dependent atelectasis in the bilateral lower lobes. 2. Mild cardiomegaly. 3. Additional ancillary findings as above without significant interval change. Signed,  Criselda Peaches,  MD  [facility]  [facility] Radiology   Electronically Signed  By: Jacqulynn Cadet M.D.  On: 06/28/2015 16:57    Impression:  Patient has stable mild aneurysmal enlargement of the ascending and proximal descending thoracic aorta. I have personally reviewed the patient's recent CT angiogram. There has been no significant internal change in the size or radiographic appearance of the patient's thoracic aorta since it was last imaged on 12/14/2013.  There are no significant luminal irregularities and no sign of any residual false lumen from the patient's previous aortic dissection. The maximum transverse diameter of the ascending thoracic aorta remains approximately 4.6-4.7 cm.  Just beyond the infundibulum of the proximal descending thoracic aorta the maximum transverse diameter remains 4.1 cm.    Plan:  The patient has been reassured that there has been no significant interval change in the radiographic appearance or size of his thoracic aorta. He does not have any residual chronic aortic dissection. He has mild aneurysmal enlargement of both the ascending and proximal descending thoracic aorta. All of his questions have been addressed. No new recommendations have been made at this time. The patient desires  to return in approximately one year for follow-up CT angiogram.  I spent in excess of 15 minutes during the conduct of this office consultation and >50% of this time involved direct face-to-face encounter with the patient for counseling and/or coordination of their care.    Valentina Gu. Roxy Manns, MD 08/01/2015 2:54 PM

## 2015-08-01 NOTE — Patient Instructions (Signed)
Continue all previous medications without any changes at this time  

## 2015-09-07 ENCOUNTER — Other Ambulatory Visit: Payer: Self-pay | Admitting: Podiatry

## 2015-09-13 ENCOUNTER — Encounter: Payer: Self-pay | Admitting: Cardiovascular Disease

## 2015-09-20 ENCOUNTER — Ambulatory Visit
Admission: RE | Admit: 2015-09-20 | Discharge: 2015-09-20 | Disposition: A | Discharge status: Home / Self Care | Payer: Medicare PPO | Source: Ambulatory Visit | Attending: Internal Medicine | Admitting: Internal Medicine

## 2015-09-20 DIAGNOSIS — G3184 Mild cognitive impairment, so stated: Secondary | ICD-10-CM

## 2015-09-23 ENCOUNTER — Ambulatory Visit: Payer: Medicare Other | Admitting: Cardiovascular Disease

## 2015-10-25 ENCOUNTER — Telehealth: Payer: Self-pay | Admitting: Cardiovascular Disease

## 2015-10-25 MED ORDER — SILDENAFIL CITRATE 100 MG PO TABS
100.0000 mg | ORAL_TABLET | Freq: Every day | ORAL | Status: DC | PRN
Start: 2015-10-25 — End: 2019-01-22

## 2015-10-25 MED ORDER — SILDENAFIL CITRATE 50 MG PO TABS: 50.0000 mg | ORAL_TABLET | Freq: Every day | ORAL | Status: DC | PRN

## 2015-10-25 NOTE — Telephone Encounter (Signed)
Spoke with patient who called to request Rx for Viagra 100 mg tabs.  I advised him that I Rx has been sent and to call back with questions or concerns.  He verbalized understanding and thanked me.

## 2015-10-25 NOTE — Telephone Encounter (Signed)
Prescription refilled per patient request.  Attempted to call patient to report, no answer, no voice mail

## 2015-10-25 NOTE — Telephone Encounter (Signed)
New Message:  Pt is calling in requesting that Dr. Acie Fredrickson prescribe Viagra for him. Please f/u

## 2015-11-01 ENCOUNTER — Other Ambulatory Visit: Payer: Medicare PPO

## 2015-11-04 ENCOUNTER — Ambulatory Visit: Payer: Medicare PPO | Admitting: Cardiovascular Disease

## 2015-11-17 ENCOUNTER — Other Ambulatory Visit: Payer: Self-pay | Admitting: Thoracic Surgery (Cardiothoracic Vascular Surgery)

## 2015-11-17 DIAGNOSIS — I712 Thoracic aortic aneurysm, without rupture, unspecified: Secondary | ICD-10-CM

## 2015-11-18 ENCOUNTER — Other Ambulatory Visit: Payer: Self-pay | Admitting: Cardiovascular Disease

## 2015-11-21 ENCOUNTER — Other Ambulatory Visit: Payer: Medicare Other

## 2015-11-24 ENCOUNTER — Other Ambulatory Visit (INDEPENDENT_AMBULATORY_CARE_PROVIDER_SITE_OTHER): Payer: Medicare Other | Admitting: *Deleted

## 2015-11-24 DIAGNOSIS — I712 Thoracic aortic aneurysm, without rupture, unspecified: Secondary | ICD-10-CM

## 2015-11-24 LAB — HEPATIC FUNCTION PANEL
ALT: 24 U/L (ref 9–46)
AST: 31 U/L (ref 10–35)
Albumin: 4.2 g/dL (ref 3.6–5.1)
Alkaline Phosphatase: 38 U/L — ABNORMAL LOW (ref 40–115)
BILIRUBIN DIRECT: 0.2 mg/dL (ref <=0.2)
Indirect Bilirubin: 0.4 mg/dL (ref 0.2–1.2)
Total Bilirubin: 0.6 mg/dL (ref 0.2–1.2)
Total Protein: 6.4 g/dL (ref 6.1–8.1)

## 2015-11-24 LAB — BASIC METABOLIC PANEL
BUN: 32 mg/dL — ABNORMAL HIGH (ref 7–25)
CALCIUM: 9.5 mg/dL (ref 8.6–10.3)
CO2: 26 mmol/L (ref 20–31)
CREATININE: 0.98 mg/dL (ref 0.70–1.11)
Chloride: 105 mmol/L (ref 98–110)
GLUCOSE: 111 mg/dL — AB (ref 65–99)
Potassium: 4.2 mmol/L (ref 3.5–5.3)
Sodium: 141 mmol/L (ref 135–146)

## 2015-11-24 LAB — LIPID PANEL
CHOL/HDL RATIO: 1.9 ratio (ref <=5.0)
CHOLESTEROL: 132 mg/dL (ref 125–200)
HDL: 70 mg/dL (ref >=40)
LDL Cholesterol: 51 mg/dL (ref <130)
Triglycerides: 54 mg/dL (ref <150)
VLDL: 11 mg/dL (ref <30)

## 2015-11-24 NOTE — Addendum Note (Signed)
Addended by: Eulis Foster on: 11/24/2015 03:40 PM   Modules accepted: Orders

## 2015-12-14 ENCOUNTER — Encounter: Payer: Self-pay | Admitting: *Deleted

## 2015-12-19 ENCOUNTER — Inpatient Hospital Stay: Admission: RE | Admit: 2015-12-19 | Payer: Medicare Other | Source: Ambulatory Visit

## 2015-12-19 ENCOUNTER — Ambulatory Visit
Admission: RE | Admit: 2015-12-19 | Discharge: 2015-12-19 | Disposition: A | Discharge status: Home / Self Care | Payer: Medicare Other | Source: Ambulatory Visit | Attending: Thoracic Surgery (Cardiothoracic Vascular Surgery) | Admitting: Thoracic Surgery (Cardiothoracic Vascular Surgery)

## 2015-12-19 ENCOUNTER — Ambulatory Visit: Payer: Medicare Other | Admitting: Cardiovascular Disease

## 2015-12-19 ENCOUNTER — Encounter: Payer: Self-pay | Admitting: Thoracic Surgery (Cardiothoracic Vascular Surgery)

## 2015-12-19 ENCOUNTER — Ambulatory Visit (INDEPENDENT_AMBULATORY_CARE_PROVIDER_SITE_OTHER): Payer: Medicare Other | Admitting: Thoracic Surgery (Cardiothoracic Vascular Surgery)

## 2015-12-19 ENCOUNTER — Ambulatory Visit: Payer: Medicare Other | Admitting: Thoracic Surgery (Cardiothoracic Vascular Surgery)

## 2015-12-19 VITALS — BP 103/59 | HR 64 | Resp 16 | Ht 64.0 in | Wt 134.0 lb

## 2015-12-19 DIAGNOSIS — I712 Thoracic aortic aneurysm, without rupture, unspecified: Secondary | ICD-10-CM

## 2015-12-19 DIAGNOSIS — I7101 Dissection of thoracic aorta: Secondary | ICD-10-CM | POA: Diagnosis not present

## 2015-12-19 DIAGNOSIS — I71019 Dissection of thoracic aorta, unspecified: Secondary | ICD-10-CM

## 2015-12-19 MED ORDER — IOPAMIDOL (ISOVUE-370) INJECTION 76%
100.0000 mL | Freq: Once | INTRAVENOUS | Status: AC | PRN
  Administered 2015-12-19: 100 mL via INTRAVENOUS

## 2015-12-19 NOTE — Patient Instructions (Signed)
Continue all previous medications without any changes at this time  

## 2015-12-19 NOTE — Progress Notes (Signed)
[address]       [place], [address]             [phone]     CARDIOTHORACIC SURGERY OFFICE NOTE  Referring Provider is Thayer Headings, MD PCP is [name], MD   HPI:  Patient returns to the office today for follow-up and surveillance related to aneurysm in the ascending thoracic aorta and transverse aortic arch. He originally presented with acute type B aortic dissection in November 2008.  The false lumen of the descending thoracic aorta eventually thrombosed. He was last seen here in follow-up in our office on 08/01/2015. Since then he has remained clinically stable with no complaints of chest pain or back pain which might be related to the patient's aorta. He continues to exercise regularly. He reports no significant physical limitations.   Current Outpatient Prescriptions  Medication Sig Dispense Refill  . ascorbic acid (VITAMIN C) 1000 MG tablet Take 1,000 mg by mouth 2 (two) times daily.     Marland Kitchen aspirin 81 MG tablet Take 81 mg by mouth at bedtime.     Marland Kitchen atorvastatin (LIPITOR) 20 MG tablet TAKE 1 TABLET (20 MG TOTAL) BY MOUTH AT BEDTIME. 30 tablet 11  . Cholecalciferol (VITAMIN D-3 PO) Take 5,000 mg by mouth daily.     . Choline Fenofibrate (FENOFIBRIC ACID) 135 MG CPDR TAKE 1 CAPSULE (135 MG TOTAL) BY MOUTH DAILY. 90 capsule 2  . ciclopirox (PENLAC) 8 % solution APPLY TO AFFECTED AREA ONCE DAILY AS DIRECTED 6.6 mL 3  . esomeprazole (NEXIUM) 40 MG capsule Take 40 mg by mouth daily before breakfast.      . finasteride (PROSCAR) 5 MG tablet Take 5 mg by mouth daily.    . fish oil-omega-3 fatty acids 1000 MG capsule Take 1,000 mg by mouth daily.     Marland Kitchen labetalol (NORMODYNE) 100 MG tablet TAKE 1 TABLET (100 MG TOTAL) BY MOUTH 2 (TWO) TIMES DAILY. 60 tablet 11  . latanoprost (XALATAN) 0.005 % ophthalmic solution Place 1 drop into both eyes at bedtime.     . Multiple Vitamin (MULITIVITAMIN WITH MINERALS) TABS Take 1 tablet by mouth daily.    . polyvinyl  alcohol (LIQUIFILM TEARS) 1.4 % ophthalmic solution Place 1 drop into both eyes every 8 (eight) hours as needed for dry eyes.    . sildenafil (VIAGRA) 100 MG tablet Take 1 tablet (100 mg total) by mouth daily as needed for erectile dysfunction. 10 tablet 6  . VAYACOG 100-19.5-6.5 MG CAPS Take 1 capsule by mouth daily.  12  . VITAMIN E PO Take 1,000 Units by mouth daily.     . VOLTAREN 1 % GEL      No current facility-administered medications for this visit.      Physical Exam:   BP 103/59 mmHg  Pulse 64  Resp 16  Ht 5\' 4"  (1.626 m)  Wt 134 lb (60.782 kg)  BMI 22.99 kg/m2  SpO2 97%  General:  Well-appearing  Chest:   Clear to auscultation  CV:   Regular rate and rhythm, No murmur appreciated  Incisions:  n/a  Abdomen:  Soft nontender  Extremities:  Warm and well perfused, palpable pulses  Diagnostic Tests:  CT ANGIOGRAPHY CHEST WITH CONTRAST  TECHNIQUE: Multidetector CT imaging of the chest was performed using the standard protocol during bolus administration of intravenous contrast. Multiplanar CT image reconstructions and MIPs were obtained to evaluate the vascular anatomy.  CONTRAST: 80 cc Isovue 370  IV  COMPARISON: 06/28/2015  FINDINGS: Mediastinum/Nodes: Mild aneurysmal dilatation of the ascending thoracic aorta and aortic arch. Ascending aorta measures up to 4.6 cm compared to 4.7 cm previously. Aortic arch measures up to 3.9 cm compared to 4.1 cm previously. Atherosclerotic calcifications throughout the aorta. Stable cardiomegaly. No evidence of aortic dissection. No mediastinal, hilar, or axillary adenopathy. Stable small venous collaterals of uncertain etiology throughout the mediastinum.  Lungs/Pleura: Dependent atelectasis in the lower lobes bilaterally. No confluent opacities or effusions.  Upper abdomen: Imaging into the upper abdomen shows no acute findings.  Musculoskeletal: Chest wall soft tissues are unremarkable. No acute bony  abnormality. Degenerative spurring throughout the thoracic spine.  Review of the MIP images confirms the above findings.  IMPRESSION: Stable aneurysmal dilatation of the ascending thoracic aorta and aortic arch, measuring maximally 4.6 cm in the ascending thoracic aorta.  Cardiomegaly.  Dependent atelectasis in the lungs.   Electronically Signed  By: Rolm Baptise M.D.  On: 12/19/2015 15:45    Impression:  Stable aneurysmal enlargement of the ascending thoracic aorta and transverse aortic arch. Maximum transverse diameter of the ascending thoracic aorta is 4.6 cm, unchanged from previous examinations.  Blood pressure remains well controlled.   Plan:  The patient will return in 1 year with follow-up CT angiogram of the chest, abdomen, and pelvis.  We have not recommended any changes to the patient's current medications.  I spent in excess of 15 minutes during the conduct of this office consultation and >50% of this time involved direct face-to-face encounter with the patient for counseling and/or coordination of their care.   Valentina Gu. Roxy Manns, MD 12/19/2015 5:20 PM

## 2015-12-20 ENCOUNTER — Encounter: Payer: Self-pay | Admitting: Cardiovascular Disease

## 2015-12-20 ENCOUNTER — Ambulatory Visit (INDEPENDENT_AMBULATORY_CARE_PROVIDER_SITE_OTHER): Payer: Medicare Other | Admitting: Cardiovascular Disease

## 2015-12-20 VITALS — BP 110/64 | HR 68 | Ht 64.0 in | Wt 139.6 lb

## 2015-12-20 DIAGNOSIS — I712 Thoracic aortic aneurysm, without rupture, unspecified: Secondary | ICD-10-CM

## 2015-12-20 DIAGNOSIS — I7101 Dissection of thoracic aorta: Secondary | ICD-10-CM

## 2015-12-20 DIAGNOSIS — I71019 Dissection of thoracic aorta, unspecified: Secondary | ICD-10-CM

## 2015-12-20 NOTE — Patient Instructions (Addendum)

## 2015-12-20 NOTE — Progress Notes (Signed)
Cardiology Office Note   Date:  12/20/2015   ID:  RIC CRAFTS, DOB 11/04/1926, MRN [id number]  PCP:  [name], MD  Cardiologist:   Acie Fredrickson Wonda Cheng, MD   Chief Complaint  Patient presents with  . Follow-up   1. Type III aortic dissection 2. Hypertension 3. Prostate cancer 4. 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 He is done quite well since I last saw him 6 months ago. [name] not had any problems with his blood pressure. [name] been taking his medications the records his blood pressure on a regular basis.  He has a history of an aortic dissection. [name] not had any further episodes of chest pain. We have followed him with CT scans and his most recent CT scans have not shown any worsening of his dissection.  February 24, 2013:  Dwayne Morris is doing well. Still swiimming regularly. No CP , no dyspnea. His CT angio of the aorta which was unremarkable. He has been found to have a left inguinal hernia.   Jan. 12, 2015:  Dwayne Morris is doing well. He has been to [place] since last saw him. Still swimming reguarly.   April 14, 2014:  Doing well. No CP or issues. Still swimming regulalry.  He has noticed that on occasion is lower than 100 - he typically holds his labetalol .  The following morning the blood pressures usually normal.   Feb. 18, 2016:   Dwayne Morris is a 82 y.o. male who presents for follow up of his aortic dissection.  His home BP readings are all good.  He swims 1/2 mile a day 3 days a week.  Walks quite a bit.  Also is active in his gardent  Has some occasional back pain if he works in his garden  Sept. 8, 2016:  Doing well.  Has some "stuffiness in his chest "  ,  Seems to be more pronounced if he is stress.  Goes away when he walks around the block   We discussed his vertebral artery dissection that occurred 20 years ago .  was hospitalized . Still has some  left sided weakness adn "stiffness"   His son , Dwayne Morris, noticed that he occasionally has some facial drooping . He has discussed with Dr. Maudie Mercury.   Dr. Maudie Mercury suggest   Brought his BP log.  All his readings are normal  Oct. 25, 2016:  Dwayne Morris is seen today for some chest discomfort. Chest pressure , minor Feels better after swimming or walking . Not worsened with deep breath ( in fact, makes it feel better ) Not worsened with arm movement  calastinics seem to help   Mostly on the left side.  Not associated with sweats or dyspnea.  Is scheduled to have colonoscopy on Nov. 4 ( Dr. Benson Norway)  [facility] to hold his ASA for a week prior to colonoscopy . Has had polys removed in the past - complicated by a lower GI bleed. Apparently a clip had come loose.   December 20, 2015:  Dwayne Morris is doing well .  BP is stable.   Brought his BP log.  Swimming regularly   Past Medical History  Diagnosis Date  . Hypertension   . Aortic dissection (HCC)     type B aortic dissection in November 2008  . Prostate cancer ([place])     PSA levels have decreased with garlic  therapy  . Stroke [facility]) 1996    History of right vertebral artery dissection with stroke  . Thoracic aortic aneurysm [facility])     Past Surgical History  Procedure Laterality Date  . Colonoscopy  09/14/2011    Procedure: COLONOSCOPY;  Surgeon: Beryle Beams, MD;  Location: [facility] ENDOSCOPY;  Service: Endoscopy;  Laterality: N/A;  . Eye surgery      catract  . Arotic discection       2012   . Colonoscopy with propofol N/A 07/01/2015    Procedure: COLONOSCOPY WITH PROPOFOL;  Surgeon: Carol Ada, MD;  Location: [facility] ENDOSCOPY;  Service: Endoscopy;  Laterality: N/A;     Current Outpatient Prescriptions  Medication Sig Dispense Refill  . ascorbic acid (VITAMIN C) 1000 MG tablet Take 1,000 mg by mouth 2 (two) times daily.     Marland Kitchen aspirin 81 MG tablet Take 81 mg by mouth at bedtime.     Marland Kitchen atorvastatin (LIPITOR) 20 MG tablet TAKE 1 TABLET (20 MG TOTAL) BY MOUTH AT  BEDTIME. 30 tablet 11  . Cholecalciferol (VITAMIN D-3 PO) Take 5,000 mg by mouth daily.     . Choline Fenofibrate (FENOFIBRIC ACID) 135 MG CPDR TAKE 1 CAPSULE (135 MG TOTAL) BY MOUTH DAILY. 90 capsule 2  . ciclopirox (PENLAC) 8 % solution APPLY TO AFFECTED AREA ONCE DAILY AS DIRECTED 6.6 mL 3  . esomeprazole (NEXIUM) 40 MG capsule Take 40 mg by mouth daily before breakfast.      . finasteride (PROSCAR) 5 MG tablet Take 5 mg by mouth daily.    . fish oil-omega-3 fatty acids 1000 MG capsule Take 1,000 mg by mouth daily.     Marland Kitchen labetalol (NORMODYNE) 100 MG tablet TAKE 1 TABLET (100 MG TOTAL) BY MOUTH 2 (TWO) TIMES DAILY. 60 tablet 11  . latanoprost (XALATAN) 0.005 % ophthalmic solution Place 1 drop into both eyes at bedtime.     . Multiple Vitamin (MULITIVITAMIN WITH MINERALS) TABS Take 1 tablet by mouth daily.    . polyvinyl alcohol (LIQUIFILM TEARS) 1.4 % ophthalmic solution Place 1 drop into both eyes every 8 (eight) hours as needed for dry eyes.    . sildenafil (VIAGRA) 100 MG tablet Take 1 tablet (100 mg total) by mouth daily as needed for erectile dysfunction. 10 tablet 6  . VAYACOG 100-19.5-6.5 MG CAPS Take 1 capsule by mouth daily.  12  . VITAMIN E PO Take 1,000 Units by mouth daily.     . VOLTAREN 1 % GEL      No current facility-administered medications for this visit.    Allergies:   Review of patient's allergies indicates no known allergies.    Social History:  The patient  reports that he quit smoking about 41 years ago. His smoking use included Cigarettes. He has a 30 pack-year smoking history. He does not have any smokeless tobacco history on file. He reports that he drinks about 2.4 oz of alcohol per week. He reports that he does not use illicit drugs.   Family History:  The patient's family history is not on file.    ROS:  Please see the history of present illness.    Review of Systems: Constitutional:  denies fever, chills, diaphoresis, appetite change and fatigue.    HEENT: denies photophobia, eye pain, redness, hearing loss, ear pain, congestion, sore throat, rhinorrhea, sneezing, neck pain, neck stiffness and tinnitus.  Respiratory: denies SOB, DOE, cough, chest tightness, and wheezing.  Cardiovascular: denies chest pain, palpitations and leg swelling.  Gastrointestinal: denies nausea, vomiting, abdominal pain, diarrhea, constipation, blood in stool.  Genitourinary: denies dysuria, urgency, frequency, hematuria, flank pain and difficulty urinating.  Musculoskeletal: denies  myalgias, back pain, joint swelling, arthralgias and gait problem.   Skin: denies pallor, rash and wound.  Neurological: denies dizziness, seizures, syncope, weakness, light-headedness, numbness and headaches.   Hematological: denies adenopathy, easy bruising, personal or family bleeding history.  Psychiatric/ Behavioral: denies suicidal ideation, mood changes, confusion, nervousness, sleep disturbance and agitation.       All other systems are reviewed and negative.    PHYSICAL EXAM: VS:  BP 110/64 mmHg  Pulse 68  Ht 5\' 4"  (1.626 m)  Wt 139 lb 9.6 oz (63.322 kg)  BMI 23.95 kg/m2 , BMI Body mass index is 23.95 kg/(m^2). GEN: Well nourished, well developed, in no acute distress HEENT: normal Neck: no JVD, carotid bruits, or masses Cardiac: RRR; no murmurs, rubs, or gallops,no edema  Respiratory:  clear to auscultation bilaterally, normal work of breathing GI: soft, nontender, nondistended, + BS MS: no deformity or atrophy Skin: warm and dry, no rash Neuro:  Strength and sensation are intact Psych: normal   EKG:  EKG is ordered today. Sept. 8, 2016:  NSR at 60 . Normal ecg   Recent Labs: 11/24/2015: ALT 24; BUN 32*; Creat 0.98; Potassium 4.2; Sodium 141    Lipid Panel    Component Value Date/Time   CHOL 132 11/24/2015 1541   TRIG 54 11/24/2015 1541   HDL 70 11/24/2015 1541   CHOLHDL 1.9 11/24/2015 1541   VLDL 11 11/24/2015 1541   LDLCALC 51 11/24/2015 1541       Wt Readings from Last 3 Encounters:  12/20/15 139 lb 9.6 oz (63.322 kg)  12/19/15 134 lb (60.782 kg)  09/20/15 132 lb (59.875 kg)      Other studies Reviewed: Additional studies/ records that were reviewed today include: . Review of the above records demonstrates:   ECG :  Sinus brady . Otherwise normal ECG   ASSESSMENT AND PLAN:  1. Type III aortic dissection - he's doing very well. He has not had any expansion or extension of his dissection.   His BP has been well controlled.   2. Hypertension - he keeps meticulous logs of his blood pressure readings. All his readings are in the normal range.  3. Prostate cancer - he follows up with Dr. Jeffie Pollock on a regular basis.  Current medicines are reviewed at length with the patient today.  The patient does not have concerns regarding medicines.  The following changes have been made:  no change   Disposition:   FU with me in 6 months .     Signed, Marisa Hufstetler, Wonda Cheng, MD  12/20/2015 11:16 AM    [address], [place], [place]  [address] Phone: [phone]; Fax: [phone]

## 2015-12-26 ENCOUNTER — Other Ambulatory Visit: Payer: Medicare Other

## 2015-12-26 ENCOUNTER — Ambulatory Visit: Payer: Medicare Other | Admitting: Thoracic Surgery (Cardiothoracic Vascular Surgery)

## 2016-03-10 ENCOUNTER — Other Ambulatory Visit: Payer: Self-pay | Admitting: Cardiovascular Disease

## 2016-04-02 ENCOUNTER — Other Ambulatory Visit: Payer: Self-pay | Admitting: Podiatry

## 2016-05-14 ENCOUNTER — Other Ambulatory Visit: Payer: Self-pay | Admitting: Cardiovascular Disease

## 2016-06-18 ENCOUNTER — Encounter (INDEPENDENT_AMBULATORY_CARE_PROVIDER_SITE_OTHER): Payer: Self-pay

## 2016-06-18 ENCOUNTER — Encounter: Payer: Self-pay | Admitting: Cardiovascular Disease

## 2016-06-18 ENCOUNTER — Ambulatory Visit (INDEPENDENT_AMBULATORY_CARE_PROVIDER_SITE_OTHER): Payer: Medicare Other | Admitting: Cardiovascular Disease

## 2016-06-18 VITALS — BP 110/60 | HR 59 | Ht 64.0 in | Wt 140.1 lb

## 2016-06-18 DIAGNOSIS — Z13220 Encounter for screening for lipoid disorders: Secondary | ICD-10-CM

## 2016-06-18 DIAGNOSIS — I10 Essential (primary) hypertension: Secondary | ICD-10-CM

## 2016-06-18 DIAGNOSIS — I712 Thoracic aortic aneurysm, without rupture, unspecified: Secondary | ICD-10-CM

## 2016-06-18 NOTE — Patient Instructions (Signed)

## 2016-06-18 NOTE — Progress Notes (Signed)
Cardiology Office Note   Date:  06/18/2016   ID:  Dwayne Morris, DOB 1927/04/07, MRN [id number]  PCP:  [name], MD  Cardiologist:   [name], MD   Chief Complaint  Patient presents with  . Follow-up   1. Type III aortic dissection 2. Hypertension 3. Prostate cancer 4. 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 He is done quite well since I last saw him 6 months ago. [name] not had any problems with his blood pressure. [name] been taking his medications the records his blood pressure on a regular basis.  He has a history of an aortic dissection. [name] not had any further episodes of chest pain. We have followed him with CT scans and his most recent CT scans have not shown any worsening of his dissection.  February 24, 2013:  DK is doing well. Still swiimming regularly. No CP , no dyspnea. His CT angio of the aorta which was unremarkable. He has been found to have a left inguinal hernia.   Jan. 12, 2015:  DK is doing well. He has been to [place] since last saw him. Still swimming reguarly.   April 14, 2014:  Doing well. No CP or issues. Still swimming regulalry.  He has noticed that on occasion is lower than 100 - he typically holds his labetalol .  The following morning the blood pressures usually normal.   Feb. 18, 2016:   Dwayne MCKEEVER is a 82 y.o. male who presents for follow up of his aortic dissection.  His home BP readings are all good.  He swims 1/2 mile a day 3 days a week.  Walks quite a bit.  Also is active in his gardent  Has some occasional back pain if he works in his garden  Sept. 8, 2016:  Doing well.  Has some "stuffiness in his chest "  ,  Seems to be more pronounced if he is stress.  Goes away when he walks around the block   We discussed his vertebral artery dissection that occurred 20 years ago .  was hospitalized . Still has some left  sided weakness adn "stiffness"   His son , Dwayne Morris, noticed that he occasionally has some facial drooping . He has discussed with Dwayne Morris.   Dwayne Morris suggest   Brought his BP log.  All his readings are normal  Oct. 25, 2016:  DK is seen today for some chest discomfort. Chest pressure , minor Feels better after swimming or walking . Not worsened with deep breath ( in fact, makes it feel better ) Not worsened with arm movement  calastinics seem to help   Mostly on the left side.  Not associated with sweats or dyspnea.  Is scheduled to have colonoscopy on Nov. 4 ( Dr. Benson Norway)  [facility] to hold his ASA for a week prior to colonoscopy . Has had polys removed in the past - complicated by a lower GI bleed. Apparently a clip had come loose.   December 20, 2015:  DK is doing well .  BP is stable.   Brought his BP log.  Swimming regularly   Oct. 23, 2017: Feeling well Still swimming 3 times a week - 1/2 mile .  Also walks 3 miles 3 times a week.  Brought his BP log .    Past Medical History:  Diagnosis Date  . Aortic  dissection ([place])    type B aortic dissection in November 2008  . Hypertension   . Prostate cancer ([place])    PSA levels have decreased with garlic therapy  . Stroke [facility]) 1996   History of right vertebral artery dissection with stroke  . Thoracic aortic aneurysm [facility])     Past Surgical History:  Procedure Laterality Date  . arotic discection      2012   . COLONOSCOPY  09/14/2011   Procedure: COLONOSCOPY;  Surgeon: Dwayne Beams, MD;  Location: [place];  Service: Endoscopy;  Laterality: N/A;  . COLONOSCOPY WITH PROPOFOL N/A 07/01/2015   Procedure: COLONOSCOPY WITH PROPOFOL;  Surgeon: Dwayne Ada, MD;  Location: [facility] ENDOSCOPY;  Service: Endoscopy;  Laterality: N/A;  . EYE SURGERY     catract     Current Outpatient Prescriptions  Medication Sig Dispense Refill  . ascorbic acid (VITAMIN C) 1000 MG tablet Take 1,000 mg by mouth 2 (two) times daily.     Marland Kitchen aspirin 81 MG  tablet Take 81 mg by mouth at bedtime.     Marland Kitchen atorvastatin (LIPITOR) 20 MG tablet TAKE 1 TABLET (20 MG TOTAL) BY MOUTH AT BEDTIME. 30 tablet 11  . Cholecalciferol (VITAMIN D-3 PO) Take 5,000 mg by mouth daily.     . Choline Fenofibrate (FENOFIBRIC ACID) 135 MG CPDR TAKE ONE CAPSULE BY MOUTH EVERY DAY 90 capsule 2  . ciclopirox (PENLAC) 8 % solution APPLY TO AFFECTED AREA ONCE DAILY AS DIRECTED 6.6 mL 3  . esomeprazole (NEXIUM) 40 MG capsule Take 40 mg by mouth daily before breakfast.      . finasteride (PROSCAR) 5 MG tablet Take 5 mg by mouth daily.    . fish oil-omega-3 fatty acids 1000 MG capsule Take 1,000 mg by mouth daily.     Marland Kitchen FLUZONE HIGH-DOSE 0.5 ML SUSY Inject as directed once.    . labetalol (NORMODYNE) 100 MG tablet TAKE 1 TABLET (100 MG TOTAL) BY MOUTH 2 (TWO) TIMES DAILY. 60 tablet 11  . latanoprost (XALATAN) 0.005 % ophthalmic solution Place 1 drop into both eyes at bedtime.     . Multiple Vitamin (MULITIVITAMIN WITH MINERALS) TABS Take 1 tablet by mouth daily.    . polyvinyl alcohol (LIQUIFILM TEARS) 1.4 % ophthalmic solution Place 1 drop into both eyes every 8 (eight) hours as needed for dry eyes.    . sildenafil (VIAGRA) 100 MG tablet Take 1 tablet (100 mg total) by mouth daily as needed for erectile dysfunction. 10 tablet 6  . VAYACOG 100-19.5-6.5 MG CAPS Take 1 capsule by mouth daily.  12  . VITAMIN E PO Take 1,000 Units by mouth daily.     . VOLTAREN 1 % GEL Apply 2 g topically 4 (four) times daily.      No current facility-administered medications for this visit.     Allergies:   Review of patient's allergies indicates no known allergies.    Social History:  The patient  reports that he quit smoking about 42 years ago. His smoking use included Cigarettes. He has a 30.00 pack-year smoking history. He does not have any smokeless tobacco history on file. He reports that he drinks about 2.4 oz of alcohol per week . He reports that he does not use drugs.   Family History:   The patient's family history is not on file.    ROS:  Please see the history of present illness.    Review of Systems: Constitutional:  denies fever, chills, diaphoresis, appetite  change and fatigue.  HEENT: denies photophobia, eye pain, redness, hearing loss, ear pain, congestion, sore throat, rhinorrhea, sneezing, neck pain, neck stiffness and tinnitus.  Respiratory: denies SOB, DOE, cough, chest tightness, and wheezing.  Cardiovascular: denies chest pain, palpitations and leg swelling.  Gastrointestinal: denies nausea, vomiting, abdominal pain, diarrhea, constipation, blood in stool.  Genitourinary: denies dysuria, urgency, frequency, hematuria, flank pain and difficulty urinating.  Musculoskeletal: denies  myalgias, back pain, joint swelling, arthralgias and gait problem.   Skin: denies pallor, rash and wound.  Neurological: denies dizziness, seizures, syncope, weakness, light-headedness, numbness and headaches.   Hematological: denies adenopathy, easy bruising, personal or family bleeding history.  Psychiatric/ Behavioral: denies suicidal ideation, mood changes, confusion, nervousness, sleep disturbance and agitation.       All other systems are reviewed and negative.    PHYSICAL EXAM: VS:  BP 110/60 (BP Location: Left Arm, Patient Position: Sitting, Cuff Size: Normal)   Pulse (!) 59   Ht 5\' 4"  (1.626 m)   Wt 140 lb 1.9 oz (63.6 kg)   BMI 24.05 kg/m  , BMI Body mass index is 24.05 kg/m. GEN: Well nourished, well developed, in no acute distress  HEENT: normal  Neck: no JVD, carotid bruits, or masses Cardiac: RRR; no murmurs, rubs, or gallops,no edema  Respiratory:  clear to auscultation bilaterally, normal work of breathing GI: soft, nontender, nondistended, + BS MS: no deformity or atrophy  Skin: warm and dry, no rash Neuro:  Strength and sensation are intact Psych: normal   EKG:  EKG is ordered today. Oct. 23, 2017:  Sinus brady at 48.  Normal ecg    Recent  Labs: 11/24/2015: ALT 24; BUN 32; Creat 0.98; Potassium 4.2; Sodium 141    Lipid Panel    Component Value Date/Time   CHOL 132 11/24/2015 1541   TRIG 54 11/24/2015 1541   HDL 70 11/24/2015 1541   CHOLHDL 1.9 11/24/2015 1541   VLDL 11 11/24/2015 1541   LDLCALC 51 11/24/2015 1541      Wt Readings from Last 3 Encounters:  06/18/16 140 lb 1.9 oz (63.6 kg)  12/20/15 139 lb 9.6 oz (63.3 kg)  12/19/15 134 lb (60.8 kg)      Other studies Reviewed: Additional studies/ records that were reviewed today include: . Review of the above records demonstrates:   ECG :  Sinus brady . Otherwise normal ECG   ASSESSMENT AND PLAN:  1. Type III aortic dissection - he's doing very well. He has not had any expansion or extension of his dissection.   His BP has been well controlled.   2. Hypertension - he keeps meticulous logs of his blood pressure readings. All his readings are in the normal range. Will see him in 6 month  3. Prostate cancer - he follows up with Dr. Jeffie Pollock on a regular basis.  Current medicines are reviewed at length with the patient today.  The patient does not have concerns regarding medicines.  The following changes have been made:  no change   Disposition:   FU with me in 6 months .     Signed, [name], MD  06/18/2016 3:29 PM    [address], [place], [place]  [address] Phone: [phone]; Fax: [phone]

## 2016-07-09 ENCOUNTER — Other Ambulatory Visit: Payer: Self-pay | Admitting: *Deleted

## 2016-07-09 DIAGNOSIS — I712 Thoracic aortic aneurysm, without rupture, unspecified: Secondary | ICD-10-CM

## 2016-08-13 ENCOUNTER — Other Ambulatory Visit: Payer: Medicare Other

## 2016-08-13 ENCOUNTER — Other Ambulatory Visit: Payer: Self-pay | Admitting: *Deleted

## 2016-08-13 ENCOUNTER — Ambulatory Visit: Payer: Medicare Other | Admitting: Thoracic Surgery (Cardiothoracic Vascular Surgery)

## 2016-08-13 MED ORDER — LABETALOL HCL 100 MG PO TABS: ORAL_TABLET | ORAL | 0 refills | Status: DC

## 2016-10-17 ENCOUNTER — Telehealth: Payer: Self-pay | Admitting: Cardiovascular Disease

## 2016-10-17 DIAGNOSIS — E782 Mixed hyperlipidemia: Secondary | ICD-10-CM

## 2016-10-17 NOTE — Telephone Encounter (Signed)
Attempted to call the pt back and no answer, and no option to leave a VM.

## 2016-10-17 NOTE — Telephone Encounter (Signed)
Patient is calling to verify if he needs to have lab work completed. Thanks.

## 2016-10-18 NOTE — Telephone Encounter (Signed)
Spoke with patient and advised him that fasting lab work is needed prior to or at next appointment. He is scheduled for lab appointment on April 30 and ov with Dr. Acie Fredrickson on 5/2. He thanked me for the call.

## 2016-11-06 ENCOUNTER — Other Ambulatory Visit: Payer: Self-pay | Admitting: *Deleted

## 2016-11-06 DIAGNOSIS — I71019 Dissection of thoracic aorta, unspecified: Secondary | ICD-10-CM

## 2016-11-06 DIAGNOSIS — I7101 Dissection of thoracic aorta: Secondary | ICD-10-CM

## 2016-11-22 ENCOUNTER — Other Ambulatory Visit: Payer: Self-pay | Admitting: Podiatry

## 2016-12-05 ENCOUNTER — Other Ambulatory Visit: Payer: Self-pay | Admitting: Cardiovascular Disease

## 2016-12-19 ENCOUNTER — Other Ambulatory Visit: Payer: Self-pay | Admitting: Podiatry

## 2016-12-24 ENCOUNTER — Telehealth: Payer: Self-pay | Admitting: Cardiovascular Disease

## 2016-12-24 ENCOUNTER — Ambulatory Visit
Admission: RE | Admit: 2016-12-24 | Discharge: 2016-12-24 | Disposition: A | Discharge status: Home / Self Care | Payer: Medicare Other | Source: Ambulatory Visit | Attending: Thoracic Surgery (Cardiothoracic Vascular Surgery) | Admitting: Thoracic Surgery (Cardiothoracic Vascular Surgery)

## 2016-12-24 ENCOUNTER — Other Ambulatory Visit: Payer: Medicare Other | Admitting: *Deleted

## 2016-12-24 ENCOUNTER — Ambulatory Visit (INDEPENDENT_AMBULATORY_CARE_PROVIDER_SITE_OTHER): Payer: Medicare Other | Admitting: Thoracic Surgery (Cardiothoracic Vascular Surgery)

## 2016-12-24 ENCOUNTER — Encounter: Payer: Self-pay | Admitting: Thoracic Surgery (Cardiothoracic Vascular Surgery)

## 2016-12-24 VITALS — BP 128/61 | HR 70 | Resp 20 | Ht 64.0 in | Wt 130.0 lb

## 2016-12-24 DIAGNOSIS — Z8679 Personal history of other diseases of the circulatory system: Secondary | ICD-10-CM

## 2016-12-24 DIAGNOSIS — I71019 Dissection of thoracic aorta, unspecified: Secondary | ICD-10-CM

## 2016-12-24 DIAGNOSIS — I7101 Dissection of thoracic aorta: Secondary | ICD-10-CM

## 2016-12-24 DIAGNOSIS — I7121 Aneurysm of the ascending aorta, without rupture: Secondary | ICD-10-CM

## 2016-12-24 DIAGNOSIS — I712 Thoracic aortic aneurysm, without rupture, unspecified: Secondary | ICD-10-CM

## 2016-12-24 DIAGNOSIS — E782 Mixed hyperlipidemia: Secondary | ICD-10-CM

## 2016-12-24 MED ORDER — IOPAMIDOL (ISOVUE-370) INJECTION 76%
75.0000 mL | Freq: Once | INTRAVENOUS | Status: AC | PRN
  Administered 2016-12-24: 75 mL via INTRAVENOUS

## 2016-12-24 NOTE — Telephone Encounter (Signed)
Error

## 2016-12-24 NOTE — Progress Notes (Signed)
[address]       [place] [address]             [phone]     CARDIOTHORACIC SURGERY OFFICE NOTE  Referring Provider is Thayer Headings, MD PCP is [name], MD   HPI:  Patient is an 83 year old male who returns to the office today for follow-up and surveillance of an aneurysm involving the ascending thoracic aorta and transverse aortic arch. 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 The false lumen of the descending thoracic aorta eventually thrombosed and healed completely. He was last seen here in our office on 12/19/2015 at which time he was doing well and his aortic aneurysm remains stable in size. Over the past year the patient has continued to do remarkably well. He swims 1/2 mile at least 3 times a week and walks several miles to his neighborhood on days that he does not swim. He reports no physical problems or limitations whatsoever. He has not developed any new medical problems. He specifically denies any history of chest pain or chest tightness either with activity or at rest.   Current Outpatient Prescriptions  Medication Sig Dispense Refill  . ascorbic acid (VITAMIN C) 1000 MG tablet Take 1,000 mg by mouth 2 (two) times daily.     Marland Kitchen aspirin 81 MG tablet Take 81 mg by mouth at bedtime.     Marland Kitchen atorvastatin (LIPITOR) 20 MG tablet TAKE 1 TABLET (20 MG TOTAL) BY MOUTH AT BEDTIME. 30 tablet 11  . Cholecalciferol (VITAMIN D-3 PO) Take 5,000 mg by mouth daily.     . Choline Fenofibrate (FENOFIBRIC ACID) 135 MG CPDR TAKE ONE CAPSULE BY MOUTH EVERY DAY 90 capsule 0  . ciclopirox (PENLAC) 8 % solution APPLY TO AFFECTED AREA ONCE DAILY AS DIRECTED 6.6 mL 0  . esomeprazole (NEXIUM) 40 MG capsule Take 40 mg by mouth daily before breakfast.      . finasteride (PROSCAR) 5 MG tablet Take 5 mg by mouth daily.    . fish oil-omega-3 fatty acids 1000 MG capsule Take 1,000 mg by mouth daily.     Marland Kitchen FLUZONE HIGH-DOSE 0.5 ML SUSY  Inject as directed once.    . labetalol (NORMODYNE) 100 MG tablet TAKE 1 TABLET (100 MG TOTAL) BY MOUTH 2 (TWO) TIMES DAILY. 180 tablet 0  . latanoprost (XALATAN) 0.005 % ophthalmic solution Place 1 drop into both eyes at bedtime.     . Multiple Vitamin (MULITIVITAMIN WITH MINERALS) TABS Take 1 tablet by mouth daily.    . polyvinyl alcohol (LIQUIFILM TEARS) 1.4 % ophthalmic solution Place 1 drop into both eyes every 8 (eight) hours as needed for dry eyes.    . sildenafil (VIAGRA) 100 MG tablet Take 1 tablet (100 mg total) by mouth daily as needed for erectile dysfunction. 10 tablet 6  . VAYACOG 100-19.5-6.5 MG CAPS Take 1 capsule by mouth daily.  12  . VITAMIN E PO Take 1,000 Units by mouth daily.     . VOLTAREN 1 % GEL Apply 2 g topically 4 (four) times daily.      No current facility-administered medications for this visit.       Physical Exam:   BP 128/61   Pulse 70   Resp 20   Ht 5\' 4"  (1.626 m)   Wt 130 lb (59 kg)   SpO2 97% Comment: RA  BMI 22.31 kg/m   General:  Well-appearing  Chest:  Clear to auscultation  CV:   Regular rate and rhythm without murmur  Incisions:  n/a  Abdomen:  Soft nontender  Extremities:  Warm and well perfused, palpable pulses  Diagnostic Tests:  CT ANGIOGRAPHY CHEST, ABDOMEN AND PELVIS  TECHNIQUE: Multidetector CT imaging through the chest, abdomen and pelvis was performed using the standard protocol during bolus administration of intravenous contrast. Multiplanar reconstructed images and MIPs were obtained and reviewed to evaluate the vascular anatomy.  CONTRAST:  75 cc of Isovue 370  COMPARISON:  12/19/2015  FINDINGS: CTA CHEST FINDINGS  Cardiovascular: There is moderate cardiac enlargement. Aneurysmal dilatation of the ascending thoracic aorta is again noted. This is stable measuring 4.6 cm. Aortic arch has a maximum diameter of 4.1 cm, image 39 of series 4. Previously 3.9 cm. Atherosclerotic calcifications throughout the  aorta. No evidence of aortic dissection. Stable small venous collaterals of uncertain etiology throughout the mediastinum.  Mediastinum/Nodes: The trachea appears patent and is midline. Normal appearance of the esophagus. No mediastinal, hilar or axillary adenopathy.  Lungs/Pleura: The lungs are clear. No pleural effusion or edema. Irregular shaped thin walled cystic structure within the superior segment of left lower lobe measures 1.9 cm, image 67 of series 5. Dependent changes are noted within the lung bases bilaterally  Musculoskeletal: Degenerative disc disease noted within the thoracic spine. Mild curvature forced the right is identified.  Review of the MIP images confirms the above findings.  CTA ABDOMEN AND PELVIS FINDINGS  VASCULAR  Aorta: Aortic atherosclerosis identified. No evidence for aneurysm, dissection, vasculitis or significant stenosis.  Celiac: Patent without evidence of aneurysm, dissection, vasculitis or significant stenosis.  SMA: Patent without evidence of aneurysm, dissection, vasculitis or significant stenosis.  Renals: Both renal arteries are patent without evidence of aneurysm, dissection, vasculitis, fibromuscular dysplasia or significant stenosis.  IMA: Patent without evidence of aneurysm, dissection, vasculitis or significant stenosis.  Inflow: Patent without evidence of aneurysm, dissection, vasculitis or significant stenosis.  Veins: No obvious venous abnormality within the limitations of this arterial phase study.  Review of the MIP images confirms the above findings.  NON-VASCULAR  Hepatobiliary: Stable small enhancing structure along the posterior dome of the liver, image 89 of series 4. Likely representing a small benign vascular malformation. The gallbladder appears normal. No biliary dilatation.  Pancreas: Unremarkable. No pancreatic ductal dilatation or surrounding inflammatory changes.  Spleen: Normal in  size without focal abnormality.  Adrenals/Urinary Tract: The adrenal glands are normal. Bilateral kidney cysts are identified. No mass or hydronephrosis identified. The urinary bladder is unremarkable.  Stomach/Bowel: Normal appearance of the stomach. The small bowel loops have a normal course and caliber. No obstruction. No pathologic dilatation of the colon. Numerous colonic diverticula noted without acute inflammation.  Lymphatic: No enlarged retroperitoneal or mesenteric adenopathy. No enlarged pelvic or inguinal lymph nodes.  Reproductive: Prostate gland is enlarged.  Other: No ascites or fluid collections. No ventral abdominal wall hernia identified.  Musculoskeletal: No aggressive lytic or sclerotic bone lesions. Degenerative disc disease noted within the lumbar spine.  Review of the MIP images confirms the above findings.  IMPRESSION: 1. No evidence for aortic dissection. 2. Stable aneurysmal dilatation of the ascending thoracic aorta and aortic arch. 3.  Aortic Atherosclerosis (ICD10-I70.0). 4. Prostate gland enlargement.   Electronically Signed   By: Kerby Moors M.D.   On: 12/24/2016 14:34    Impression:  Stable moderate aneurysmal enlargement of the ascending thoracic aorta and transverse aortic arch.  The size of the patient's thoracic aorta has not  changed significantly over the last several years.  Plan:  I have discussed options at length with the patient in the office today including continued annual surveillance, decreased frequency of surveillance, or perhaps even stopping surveillance CT angiograms altogether.  The patient does not wish to stop surveillance completely, but he is amenable to decrease in the frequency given the fact that his aorta has remained stable for some time now. We plan follow-up CT angiogram in 2 years. He will call and return sooner should specific worrisome symptoms develop.  I spent in excess of 15 minutes during  the conduct of this office consultation and >50% of this time involved direct face-to-face encounter with the patient for counseling and/or coordination of their care.    Valentina Gu. Roxy Manns, MD 12/24/2016 3:50 PM

## 2016-12-24 NOTE — Patient Instructions (Signed)
Continue all previous medications without any changes at this time  

## 2016-12-25 LAB — COMPREHENSIVE METABOLIC PANEL
ALBUMIN: 4.2 g/dL (ref 3.5–4.7)
ALK PHOS: 43 IU/L (ref 39–117)
ALT: 23 IU/L (ref 0–44)
AST: 26 IU/L (ref 0–40)
Albumin/Globulin Ratio: 1.7 (ref 1.2–2.2)
BILIRUBIN TOTAL: 0.4 mg/dL (ref 0.0–1.2)
BUN / CREAT RATIO: 24 (ref 10–24)
BUN: 21 mg/dL (ref 8–27)
CHLORIDE: 102 mmol/L (ref 96–106)
CO2: 24 mmol/L (ref 18–29)
Calcium: 9.4 mg/dL (ref 8.6–10.2)
Creatinine, Ser: 0.86 mg/dL (ref 0.76–1.27)
GFR calc Af Amer: 90 mL/min/{1.73_m2} (ref >59)
GFR calc non Af Amer: 77 mL/min/{1.73_m2} (ref >59)
GLOBULIN, TOTAL: 2.5 g/dL (ref 1.5–4.5)
GLUCOSE: 95 mg/dL (ref 65–99)
Potassium: 4.1 mmol/L (ref 3.5–5.2)
SODIUM: 141 mmol/L (ref 134–144)
Total Protein: 6.7 g/dL (ref 6.0–8.5)

## 2016-12-25 LAB — LIPID PANEL
CHOLESTEROL TOTAL: 126 mg/dL (ref 100–199)
Chol/HDL Ratio: 2.1 ratio (ref 0.0–5.0)
HDL: 61 mg/dL (ref >39)
LDL Calculated: 56 mg/dL (ref 0–99)
Triglycerides: 47 mg/dL (ref 0–149)
VLDL Cholesterol Cal: 9 mg/dL (ref 5–40)

## 2016-12-26 ENCOUNTER — Encounter: Payer: Self-pay | Admitting: Cardiovascular Disease

## 2016-12-26 ENCOUNTER — Encounter (INDEPENDENT_AMBULATORY_CARE_PROVIDER_SITE_OTHER): Payer: Self-pay

## 2016-12-26 ENCOUNTER — Ambulatory Visit (INDEPENDENT_AMBULATORY_CARE_PROVIDER_SITE_OTHER): Payer: Medicare Other | Admitting: Cardiovascular Disease

## 2016-12-26 VITALS — BP 102/60 | HR 70 | Ht 64.0 in | Wt 137.4 lb

## 2016-12-26 DIAGNOSIS — I10 Essential (primary) hypertension: Secondary | ICD-10-CM

## 2016-12-26 DIAGNOSIS — I712 Thoracic aortic aneurysm, without rupture, unspecified: Secondary | ICD-10-CM

## 2016-12-26 NOTE — Patient Instructions (Signed)

## 2016-12-26 NOTE — Progress Notes (Signed)
Cardiology Office Note   Date:  12/26/2016   ID:  Dwayne Morris, DOB 13-Nov-1926, MRN [id number]  PCP:  Dwayne Gravel, Dwayne Morris  Cardiologist:   Dwayne Moores, Dwayne Morris   Chief Complaint  Patient presents with  . Hypertension   1. Type III aortic dissection 2. Hypertension 3. Prostate cancer 4. 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  gentleman originally from [place]. He is done quite well since I last saw him 6 months ago. [name] not had any problems with his blood pressure. [name] been taking his medications the records his blood pressure on a regular basis.  He has a history of an aortic dissection. [name] not had any further episodes of chest pain. We have followed him with CT scans and his most recent CT scans have not shown any worsening of his dissection.  February 24, 2013:  Dwayne Morris is doing well. Still swiimming regularly. No CP , no dyspnea. His CT angio of the aorta which was unremarkable. He has been found to have a left inguinal hernia.   Jan. 12, 2015:  Dwayne Morris is doing well. He has been to [place] since last saw him. Still swimming reguarly.   April 14, 2014:  Doing well. No CP or issues. Still swimming regulalry.  He has noticed that on occasion is lower than 100 - he typically holds his labetalol .  The following morning the blood pressures usually normal.   Feb. 18, 2016:   Dwayne Morris is a 83 y.o. male who presents for follow up of his aortic dissection.  His home BP readings are all good.  He swims 1/2 mile a day 3 days a week.  Walks quite a bit.  Also is active in his gardent  Has some occasional back pain if he works in his garden  Sept. 8, 2016:  Doing well.  Has some "stuffiness in his chest "  ,  Seems to be more pronounced if he is stress.  Goes away when he walks around the block   We discussed his vertebral artery dissection that occurred 20 years ago .  was hospitalized . Still has some left  sided weakness adn "stiffness"   His son , Dwayne Morris, noticed that he occasionally has some facial drooping . He has discussed with Dwayne Morris.   Dwayne Morris suggest   Brought his BP log.  All his readings are normal  Oct. 25, 2016:  Dwayne Morris is seen today for some chest discomfort. Chest pressure , minor Feels better after swimming or walking . Not worsened with deep breath ( in fact, makes it feel better ) Not worsened with arm movement  calastinics seem to help   Mostly on the left side.  Not associated with sweats or dyspnea.  Is scheduled to have colonoscopy on Nov. 4 ( Dr. Benson Morris)  [facility] to hold his ASA for a week prior to colonoscopy . Has had polys removed in the past - complicated by a lower GI bleed. Apparently a clip had come loose.   December 20, 2015:  Dwayne Morris is doing well .  BP is stable.   Brought his BP log.  Swimming regularly   Oct. 23, 2017: Feeling well Still swimming 3 times a week - 1/2 mile .  Also walks 3 miles 3 times a week.  Brought his BP log .  Dec 26, 2016: Dwayne Morris is doing well Swims regularly .  Walks 3 miles a day also  No CP or dyspnea.  BP records were reviewed .  Was seen by Dwayne Morris .  CT at that time showed that the dissection was completely healed.    Past Medical History:  Diagnosis Date  . Aortic dissection (HCC)    type B aortic dissection in November 2008  . Hypertension   . Prostate cancer ([place])    PSA levels have decreased with garlic therapy  . Stroke [facility]) 1996   History of right vertebral artery dissection with stroke  . Thoracic aortic aneurysm [facility])     Past Surgical History:  Procedure Laterality Date  . arotic discection      2012   . COLONOSCOPY  09/14/2011   Procedure: COLONOSCOPY;  Surgeon: Dwayne Beams, Dwayne Morris;  Location: [facility];  Service: Endoscopy;  Laterality: N/A;  . COLONOSCOPY WITH PROPOFOL N/A 07/01/2015   Procedure: COLONOSCOPY WITH PROPOFOL;  Surgeon: Dwayne Ada, Dwayne Morris;  Location: [facility];  Service: Endoscopy;   Laterality: N/A;  . EYE SURGERY     catract     Current Outpatient Prescriptions  Medication Sig Dispense Refill  . ascorbic acid (VITAMIN C) 1000 MG tablet Take 1,000 mg by mouth 2 (two) times daily.     Marland Kitchen aspirin 81 MG tablet Take 81 mg by mouth at bedtime.     Marland Kitchen atorvastatin (LIPITOR) 20 MG tablet TAKE 1 TABLET (20 MG TOTAL) BY MOUTH AT BEDTIME. 30 tablet 11  . Cholecalciferol (VITAMIN D-3 PO) Take 5,000 mg by mouth daily.     . Choline Fenofibrate (FENOFIBRIC ACID) 135 MG CPDR TAKE ONE CAPSULE BY MOUTH EVERY DAY 90 capsule 0  . ciclopirox (PENLAC) 8 % solution APPLY TO AFFECTED AREA ONCE DAILY AS DIRECTED 6.6 mL 0  . esomeprazole (NEXIUM) 40 MG capsule Take 40 mg by mouth daily before breakfast.      . finasteride (PROSCAR) 5 MG tablet Take 5 mg by mouth daily.    . fish oil-omega-3 fatty acids 1000 MG capsule Take 1,000 mg by mouth daily.     Marland Kitchen FLUZONE HIGH-DOSE 0.5 ML SUSY Inject as directed once.    . labetalol (NORMODYNE) 100 MG tablet TAKE 1 TABLET (100 MG TOTAL) BY MOUTH 2 (TWO) TIMES DAILY. 180 tablet 0  . latanoprost (XALATAN) 0.005 % ophthalmic solution Place 1 drop into both eyes at bedtime.     . Multiple Vitamin (MULITIVITAMIN WITH MINERALS) TABS Take 1 tablet by mouth daily.    . polyvinyl alcohol (LIQUIFILM TEARS) 1.4 % ophthalmic solution Place 1 drop into both eyes every 8 (eight) hours as needed for dry eyes.    . sildenafil (VIAGRA) 100 MG tablet Take 1 tablet (100 mg total) by mouth daily as needed for erectile dysfunction. 10 tablet 6  . VAYACOG 100-19.5-6.5 MG CAPS Take 1 capsule by mouth daily.  12  . VITAMIN E PO Take 1,000 Units by mouth daily.     . VOLTAREN 1 % GEL Apply 2 g topically 4 (four) times daily.      No current facility-administered medications for this visit.     Allergies:   Patient has no known allergies.    Social History:  The patient  reports that he quit smoking about 42 years ago. His smoking use included Cigarettes. He has a 30.00  pack-year smoking history. He has never used smokeless tobacco. He reports that he drinks about 2.4 oz of alcohol per week . He reports that he does  not use drugs.   Family History:  The patient's family history is not on file.    ROS:  Please see the history of present illness.    Review of Systems: Constitutional:  denies fever, chills, diaphoresis, appetite change and fatigue.  HEENT: denies photophobia, eye pain, redness, hearing loss, ear pain, congestion, sore throat, rhinorrhea, sneezing, neck pain, neck stiffness and tinnitus.  Respiratory: denies SOB, DOE, cough, chest tightness, and wheezing.  Cardiovascular: denies chest pain, palpitations and leg swelling.  Gastrointestinal: denies nausea, vomiting, abdominal pain, diarrhea, constipation, blood in stool.  Genitourinary: denies dysuria, urgency, frequency, hematuria, flank pain and difficulty urinating.  Musculoskeletal: denies  myalgias, back pain, joint swelling, arthralgias and gait problem.   Skin: denies pallor, rash and wound.  Neurological: denies dizziness, seizures, syncope, weakness, light-headedness, numbness and headaches.   Hematological: denies adenopathy, easy bruising, personal or family bleeding history.  Psychiatric/ Behavioral: denies suicidal ideation, mood changes, confusion, nervousness, sleep disturbance and agitation.       All other systems are reviewed and negative.    PHYSICAL EXAM: VS:  BP 102/60 (BP Location: Left Arm, Patient Position: Sitting, Cuff Size: Normal)   Pulse 70   Ht 5\' 4"  (1.626 m)   Wt 137 lb 6.4 oz (62.3 kg)   SpO2 95%   BMI 23.58 kg/m  , BMI Body mass index is 23.58 kg/m. GEN: Well nourished, well developed, in no acute distress  HEENT: normal  Neck: no JVD, carotid bruits, or masses Cardiac: RRR; no murmurs, rubs, or gallops,no edema  Respiratory:  clear to auscultation bilaterally, normal work of breathing GI: soft, nontender, nondistended, + BS MS: no deformity or  atrophy  Skin: warm and dry, no rash Neuro:  Strength and sensation are intact Psych: normal   EKG:  EKG is ordered today. Oct. 23, 2017:  Sinus brady at 19.  Normal ecg    Recent Labs: 12/24/2016: ALT 23; BUN 21; Creatinine, Ser 0.86; Potassium 4.1; Sodium 141    Lipid Panel    Component Value Date/Time   CHOL 126 12/24/2016 1621   TRIG 47 12/24/2016 1621   HDL 61 12/24/2016 1621   CHOLHDL 2.1 12/24/2016 1621   CHOLHDL 1.9 11/24/2015 1541   VLDL 11 11/24/2015 1541   LDLCALC 56 12/24/2016 1621      Wt Readings from Last 3 Encounters:  12/26/16 137 lb 6.4 oz (62.3 kg)  12/24/16 130 lb (59 kg)  06/18/16 140 lb 1.9 oz (63.6 kg)      Other studies Reviewed: Additional studies/ records that were reviewed today include: . Review of the above records demonstrates:   ECG :      ASSESSMENT AND PLAN:  1. Type III aortic dissection - he's doing very well. He has not had any expansion or extension of his dissection.   His BP has been well controlled.   2. Hypertension - he keeps meticulous logs of his blood pressure readings. All his readings are in the normal range. Will see him in 6 month  3. Prostate cancer - he follows up with Dr. Jeffie Pollock on a regular basis.  Current medicines are reviewed at length with the patient today.  The patient does not have concerns regarding medicines.  The following changes have been made:  no change   Disposition:   FU with me in 6 months .     Signed, Dwayne Moores, Dwayne Morris  12/26/2016 12:08 PM    [place],  , [place]  [address] Phone: [phone]; Fax: [phone]  

## 2017-01-21 ENCOUNTER — Other Ambulatory Visit: Payer: Self-pay | Admitting: Podiatry

## 2017-01-29 ENCOUNTER — Other Ambulatory Visit: Payer: Self-pay | Admitting: Cardiovascular Disease

## 2017-03-04 ENCOUNTER — Other Ambulatory Visit: Payer: Self-pay | Admitting: Cardiovascular Disease

## 2017-06-01 ENCOUNTER — Other Ambulatory Visit: Payer: Self-pay | Admitting: Cardiovascular Disease

## 2017-06-19 ENCOUNTER — Other Ambulatory Visit: Payer: Self-pay | Admitting: Podiatry

## 2017-06-28 ENCOUNTER — Other Ambulatory Visit: Payer: Self-pay | Admitting: Podiatry

## 2017-07-04 ENCOUNTER — Other Ambulatory Visit: Payer: Self-pay | Admitting: Podiatry

## 2017-11-06 ENCOUNTER — Other Ambulatory Visit: Payer: Self-pay | Admitting: Cardiovascular Disease

## 2017-11-23 ENCOUNTER — Other Ambulatory Visit: Payer: Self-pay | Admitting: Cardiovascular Disease

## 2018-01-03 ENCOUNTER — Other Ambulatory Visit: Payer: Self-pay | Admitting: Cardiovascular Disease

## 2018-01-12 ENCOUNTER — Other Ambulatory Visit: Payer: Self-pay | Admitting: Cardiovascular Disease

## 2018-01-13 ENCOUNTER — Telehealth: Payer: Self-pay | Admitting: Cardiovascular Disease

## 2018-01-13 NOTE — Telephone Encounter (Signed)
Outpatient Medication Detail    Disp Refills Start End   atorvastatin (LIPITOR) 20 MG tablet 30 tablet 0 01/13/2018    Sig: Take 1 tablet by mouth everyday at bedtime. Please call and schedule an appointment for further refills 1st attempt   Sent to pharmacy as: atorvastatin (LIPITOR) 20 MG tablet   E-Prescribing Status: Receipt confirmed by pharmacy (01/13/2018 12:34 PM EDT)   Pharmacy   [id number] - [place], [place] - [address]   Labetalol was sent in on 01/29/17 for #180 with 3 refills. Patient has an upcoming appointment.

## 2018-01-13 NOTE — Telephone Encounter (Signed)
New Message:       *STAT* If patient is at the pharmacy, call can be transferred to refill team.   1. Which medications need to be refilled? (please list name of each medication and dose if known) atorvastatin (LIPITOR) 20 MG tablet  labetalol (NORMODYNE) 100 MG tablet  2. Which pharmacy/location (including street and city if local pharmacy) is medication to be sent to?[id number] - [place], [place] - [address] AT [place]  3. Do they need a 30 day or 90 day supply? [place]

## 2018-01-15 ENCOUNTER — Encounter (INDEPENDENT_AMBULATORY_CARE_PROVIDER_SITE_OTHER): Payer: Self-pay

## 2018-01-15 ENCOUNTER — Encounter: Payer: Self-pay | Admitting: Cardiovascular Disease

## 2018-01-15 ENCOUNTER — Ambulatory Visit: Payer: Medicare Other | Admitting: Cardiovascular Disease

## 2018-01-15 VITALS — BP 122/58 | HR 64 | Ht 64.0 in | Wt 138.4 lb

## 2018-01-15 DIAGNOSIS — I7100 Dissection of unspecified site of aorta: Secondary | ICD-10-CM

## 2018-01-15 DIAGNOSIS — E782 Mixed hyperlipidemia: Secondary | ICD-10-CM

## 2018-01-15 MED ORDER — FENOFIBRIC ACID 135 MG PO CPDR: 135.0000 mg | DELAYED_RELEASE_CAPSULE | Freq: Every day | ORAL | 3 refills | Status: DC

## 2018-01-15 MED ORDER — ATORVASTATIN CALCIUM 20 MG PO TABS: ORAL_TABLET | ORAL | 3 refills | Status: DC

## 2018-01-15 MED ORDER — LABETALOL HCL 100 MG PO TABS: 100.0000 mg | ORAL_TABLET | Freq: Two times a day (BID) | ORAL | 3 refills | Status: DC

## 2018-01-15 NOTE — Progress Notes (Signed)
Cardiology Office Note   Date:  01/15/2018   ID:  Dwayne Morris, DOB [birth year], MRN [id number]  PCP:  [name], MD  Cardiologist:   [name], MD   Chief Complaint  Patient presents with  . Hypertension  . Thoracic Aortic Dissection   1. Type III aortic dissection 2. Hypertension 3. Prostate cancer 4. 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  gentleman originally from [place]. He is done quite well since I last saw him 6 months ago. [name] not had any problems with his blood pressure. [name] been taking his medications the records his blood pressure on a regular basis.  He has a history of an aortic dissection. [name] not had any further episodes of chest pain. We have followed him with CT scans and his most recent CT scans have not shown any worsening of his dissection.  February 24, 2013:  DK is doing well. Still swiimming regularly. No CP , no dyspnea. His CT angio of the aorta which was unremarkable. He has been found to have a left inguinal hernia.   Jan. 12, 2015:  DK is doing well. He has been to [place] since last saw him. Still swimming reguarly.   April 14, 2014:  Doing well. No CP or issues. Still swimming regulalry.  He has noticed that on occasion is lower than 100 - he typically holds his labetalol .  The following morning the blood pressures usually normal.   Feb. 18, 2016:   Dwayne Morris is a 84 y.o. male who presents for follow up of his aortic dissection.  His home BP readings are all good.  He swims 1/2 mile a day 3 days a week.  Walks quite a bit.  Also is active in his gardent  Has some occasional back pain if he works in his garden  Sept. 8, 2016:  Doing well.  Has some "stuffiness in his chest "  ,  Seems to be more pronounced if he is stress.  Goes away when he walks around the block   We discussed his vertebral artery dissection that occurred 20 years ago .  was hospitalized . Still has some  left sided weakness adn "stiffness"   His son , Dwayne Morris, noticed that he occasionally has some facial drooping . He has discussed with Dr. Maudie Mercury.   Dr. Maudie Mercury suggest   Brought his BP log.  All his readings are normal  Oct. 25, 2016:  DK is seen today for some chest discomfort. Chest pressure , minor Feels better after swimming or walking . Not worsened with deep breath ( in fact, makes it feel better ) Not worsened with arm movement  calastinics seem to help   Mostly on the left side.  Not associated with sweats or dyspnea.  Is scheduled to have colonoscopy on Nov. 4 ( Dr. Benson Norway)  [facility] to hold his ASA for a week prior to colonoscopy . Has had polys removed in the past - complicated by a lower GI bleed. Apparently a clip had come loose.   December 20, 2015:  DK is doing well .  BP is stable.   Brought his BP log.  Swimming regularly   Oct. 23, 2017: Feeling well Still swimming 3 times a week - 1/2 mile .  Also walks 3 miles 3 times a week.  Brought his BP log .  Dec 26, 2016: DK is doing well Swims  regularly .   Walks 3 miles a day also  No CP or dyspnea.  BP records were reviewed .  Was seen by Dr. Roxy Manns .  CT at that time showed that the dissection was completely healed.   Jan 15, 2018:   Doing well Shared recent news about his son Dwayne Morris. BP is great. Brought his BP log  Shared a bottle of  Merlot with me    Past Medical History:  Diagnosis Date  . Aortic dissection (HCC)    type B aortic dissection in November 2008  . Hypertension   . Prostate cancer (Dwayne Morris)    PSA levels have decreased with garlic therapy  . Stroke [facility]) 1996   History of right vertebral artery dissection with stroke  . Thoracic aortic aneurysm [facility])     Past Surgical History:  Procedure Laterality Date  . arotic discection      2012   . COLONOSCOPY  09/14/2011   Procedure: COLONOSCOPY;  Surgeon: Beryle Beams, MD;  Location: [facility];  Service: Endoscopy;  Laterality: N/A;  . COLONOSCOPY WITH  PROPOFOL N/A 07/01/2015   Procedure: COLONOSCOPY WITH PROPOFOL;  Surgeon: Carol Ada, MD;  [facility];  Service: Endoscopy;  Laterality: N/A;  . EYE SURGERY     catract     Current Outpatient Medications  Medication Sig Dispense Refill  . ascorbic acid (VITAMIN C) 1000 MG tablet Take 1,000 mg by mouth 2 (two) times daily.     Marland Kitchen aspirin 81 MG tablet Take 81 mg by mouth at bedtime.     Marland Kitchen atorvastatin (LIPITOR) 20 MG tablet Take 1 tablet by mouth everyday at bedtime. Please call and schedule an appointment for further refills 1st attempt 30 tablet 0  . Cholecalciferol (VITAMIN D-3 PO) Take 5,000 mg by mouth daily.     . Choline Fenofibrate (FENOFIBRIC ACID) 135 MG CPDR Take 135 mg by mouth daily. Patient needs to call and schedule an appointment for further refills 2nd attempt 15 capsule 0  . ciclopirox (PENLAC) 8 % solution APPLY TO AFFECTED AREA ONCE DAILY AS DIRECTED 6.6 mL 0  . esomeprazole (NEXIUM) 40 MG capsule Take 40 mg by mouth daily before breakfast.      . finasteride (PROSCAR) 5 MG tablet Take 5 mg by mouth daily.    . fish oil-omega-3 fatty acids 1000 MG capsule Take 1,000 mg by mouth daily.     Marland Kitchen FLUZONE HIGH-DOSE 0.5 ML SUSY Inject as directed once.    . labetalol (NORMODYNE) 100 MG tablet Take 1 tablet (100 mg total) by mouth 2 (two) times daily. 180 tablet 3  . latanoprost (XALATAN) 0.005 % ophthalmic solution Place 1 drop into both eyes at bedtime.     . Multiple Vitamin (MULITIVITAMIN WITH MINERALS) TABS Take 1 tablet by mouth daily.    . polyvinyl alcohol (LIQUIFILM TEARS) 1.4 % ophthalmic solution Place 1 drop into both eyes every 8 (eight) hours as needed for dry eyes.    . sildenafil (VIAGRA) 100 MG tablet Take 1 tablet (100 mg total) by mouth daily as needed for erectile dysfunction. 10 tablet 6  . VAYACOG 100-19.5-6.5 MG CAPS Take 1 capsule by mouth daily.  12  . VITAMIN E PO Take 1,000 Units by mouth daily.      No current facility-administered  medications for this visit.     Allergies:   Patient has no known allergies.    Social History:  The patient  reports that he quit smoking  about 43 years ago. His smoking use included cigarettes. He has a 30.00 pack-year smoking history. He has never used smokeless tobacco. He reports that he drinks about 2.4 oz of alcohol per week. He reports that he does not use drugs.   Family History:  The patient's family history is not on file.    ROS:   Noted in current hx      All other systems are reviewed and negative.   Physical Exam: Blood pressure (!) 122/58, pulse 64, height 5\' 4"  (1.626 m), weight 138 lb 6.4 oz (62.8 kg), SpO2 95 %.  GEN:   Elderly male, NAD  HEENT: Normal NECK: No JVD; No carotid bruits LYMPHATICS: No lymphadenopathy CARDIAC: RRR   RESPIRATORY:  Clear to auscultation without rales, wheezing or rhonchi  ABDOMEN: Soft, non-tender, non-distended MUSCULOSKELETAL:  No edema; No deformity  SKIN: Warm and dry NEUROLOGIC:  Alert and oriented x 3   EKG:     Jan 15, 2018:   NSR at 26.  Normal ECG     Recent Labs: No results found for requested labs within last 8760 hours.    Lipid Panel    Component Value Date/Time   CHOL 126 12/24/2016 1621   TRIG 47 12/24/2016 1621   HDL 61 12/24/2016 1621   CHOLHDL 2.1 12/24/2016 1621   CHOLHDL 1.9 11/24/2015 1541   VLDL 11 11/24/2015 1541   LDLCALC 56 12/24/2016 1621      Wt Readings from Last 3 Encounters:  01/15/18 138 lb 6.4 oz (62.8 kg)  12/26/16 137 lb 6.4 oz (62.3 kg)  12/24/16 130 lb (59 kg)      Other studies Reviewed: Additional studies/ records that were reviewed today include: . Review of the above records demonstrates:   ECG :      ASSESSMENT AND PLAN:  1. Type III aortic dissection -he has a type III aortic dissection.  He seems to be stable.  2. Hypertension -brought his blood pressure log with him.  His blood pressures are all in the normal range.  Continue current medications.  3.  Prostate cancer - he follows up with Dr. Jeffie Pollock on a regular basis.  Current medicines are reviewed at length with the patient today.  The patient does not have concerns regarding medicines.  The following changes have been made:  no change   Disposition:   FU with me in 6 months .     Signed, [name], MD  01/15/2018 4:37 PM    [address], [place], [place]  [address] Phone: [phone]; Fax: [phone]

## 2018-01-15 NOTE — Patient Instructions (Signed)
Medication Instructions:  Your physician recommends that you continue on your current medications as directed. Please refer to the Current Medication list given to you today.   Labwork: None ordered  Testing/Procedures: None ordered  Follow-Up: Your physician wants you to follow-up in: 6 months with Dr. Nahser. You will receive a reminder letter in the mail two months in advance. If you don't receive a letter, please call our office to schedule the follow-up appointment.   Any Other Special Instructions Will Be Listed Below (If Applicable).     If you need a refill on your cardiac medications before your next appointment, please call your pharmacy.   

## 2018-04-18 ENCOUNTER — Telehealth: Payer: Self-pay | Admitting: Cardiovascular Disease

## 2018-04-18 DIAGNOSIS — I7100 Dissection of unspecified site of aorta: Secondary | ICD-10-CM

## 2018-04-18 DIAGNOSIS — E782 Mixed hyperlipidemia: Secondary | ICD-10-CM

## 2018-04-18 NOTE — Telephone Encounter (Signed)
New Message:   Pt is questioning if he will need labs before appt 07/14/18

## 2018-05-01 NOTE — Telephone Encounter (Signed)
Scheduled patient for lab appointment on 11/14, a few days prior to appointment with Dr. Acie Fredrickson I attempted to call patient to discuss but there was no answer and no voice mail

## 2018-05-02 NOTE — Telephone Encounter (Signed)
Attempted to call patient, no answer and no voice mail 

## 2018-05-05 NOTE — Telephone Encounter (Signed)
I have attempted to call patient numerous times and there is no answer and no voice mail. I have scheduled patient's lab appointment for 11/14, a few days prior to his office visit with Dr. Acie Fredrickson.

## 2018-07-10 ENCOUNTER — Other Ambulatory Visit: Payer: Medicare Other

## 2018-07-14 ENCOUNTER — Ambulatory Visit: Payer: Medicare Other | Admitting: Cardiovascular Disease

## 2018-07-14 ENCOUNTER — Encounter: Payer: Self-pay | Admitting: Cardiovascular Disease

## 2018-07-14 VITALS — BP 96/46 | HR 74 | Ht 64.0 in | Wt 134.0 lb

## 2018-07-14 DIAGNOSIS — I10 Essential (primary) hypertension: Secondary | ICD-10-CM

## 2018-07-14 DIAGNOSIS — I712 Thoracic aortic aneurysm, without rupture, unspecified: Secondary | ICD-10-CM

## 2018-07-14 DIAGNOSIS — E782 Mixed hyperlipidemia: Secondary | ICD-10-CM | POA: Diagnosis not present

## 2018-07-14 MED ORDER — ATORVASTATIN CALCIUM 20 MG PO TABS: ORAL_TABLET | ORAL | 3 refills | Status: DC

## 2018-07-14 NOTE — Patient Instructions (Addendum)
Medication Instructions:  Your physician recommends that you continue on your current medications as directed. Please refer to the Current Medication list given to you today.  If you need a refill on your cardiac medications before your next appointment, please call your pharmacy.   Lab work: Your physician recommends that you return for lab work in: 6 months on Monday May 18. You may come in anytime after 7:30 am on this day You will need to FAST for this appointment - nothing to eat or drink after midnight the night before except water.     Testing/Procedures: None Ordered   Follow-Up: At [facility], you and your health needs are our priority.  As part of our continuing mission to provide you with exceptional heart care, we have created designated Provider Care Teams.  These Care Teams include your primary Cardiologist (physician) and Advanced Practice Providers (APPs -  Physician Assistants and Nurse Practitioners) who all work together to provide you with the care you need, when you need it. You will need a follow up appointment in:  6 months.  Please call our office 2 months in advance to schedule this appointment.  You may see [name], MD or one of the following Advanced Practice Providers on your designated Care Team: Richardson Dopp, PA-C [place], [place] . Daune Perch, NP

## 2018-07-14 NOTE — Progress Notes (Signed)
Cardiology Office Note   Date:  07/14/2018   ID:  Dwayne Morris, DOB [birth year], MRN [id number]  PCP:  [name], MD  Cardiologist:   [name], MD   Chief Complaint  Patient presents with  . Thoracic Aortic Dissection   1. Type III aortic dissection 2. Hypertension 3. Prostate cancer 4. 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  gentleman originally from [place]. He is done quite well since I last saw him 6 months ago. [name] not had any problems with his blood pressure. [name] been taking his medications the records his blood pressure on a regular basis.  He has a history of an aortic dissection. [name] not had any further episodes of chest pain. We have followed him with CT scans and his most recent CT scans have not shown any worsening of his dissection.  February 24, 2013:  Dwayne Morris is doing well. Still swiimming regularly. No CP , no dyspnea. His CT angio of the aorta which was unremarkable. He has been found to have a left inguinal hernia.   Jan. 12, 2015:  Dwayne Morris is doing well. He has been to [place] since last saw him. Still swimming reguarly.   April 14, 2014:  Doing well. No CP or issues. Still swimming regulalry.  He has noticed that on occasion is lower than 100 - he typically holds his labetalol .  The following morning the blood pressures usually normal.   Feb. 18, 2016:   Dwayne Morris is a 84 y.o. male who presents for follow up of his aortic dissection.  His home BP readings are all good.  He swims 1/2 mile a day 3 days a week.  Walks quite a bit.  Also is active in his gardent  Has some occasional back pain if he works in his garden  Sept. 8, 2016:  Doing well.  Has some "stuffiness in his chest "  ,  Seems to be more pronounced if he is stress.  Goes away when he walks around the block   We discussed his vertebral artery dissection that occurred 20 years ago .  was hospitalized . Still has some left sided  weakness adn "stiffness"   His son , Dwayne Morris, noticed that he occasionally has some facial drooping . He has discussed with Dr. Maudie Mercury.   Dr. Maudie Mercury suggest   Brought his BP log.  All his readings are normal  Oct. 25, 2016:  Dwayne Morris is seen today for some chest discomfort. Chest pressure , minor Feels better after swimming or walking . Not worsened with deep breath ( in fact, makes it feel better ) Not worsened with arm movement  calastinics seem to help   Mostly on the left side.  Not associated with sweats or dyspnea.  Is scheduled to have colonoscopy on Nov. 4 ( Dr. Benson Norway)  [facility] to hold his ASA for a week prior to colonoscopy . Has had polys removed in the past - complicated by a lower GI bleed. Apparently a clip had come loose.   December 20, 2015:  Dwayne Morris is doing well .  BP is stable.   Brought his BP log.  Swimming regularly   Oct. 23, 2017: Feeling well Still swimming 3 times a week - 1/2 mile .  Also walks 3 miles 3 times a week.  Brought his BP log .  Dec 26, 2016: Dwayne Morris is doing well Swims regularly .  Walks 3 miles a day also  No CP or dyspnea.  BP records were reviewed .  Was seen by Dr. Roxy Manns .  CT at that time showed that the dissection was completely healed.   Jan 15, 2018:   Doing well Shared recent news about his son Dwayne Morris. BP is great. Brought his BP log  Shared a bottle of  Merlot with me   Nov. 18, 2019:    Doing great We disscussed his son's recent achievements  Feels well  No further CP or abd pain  Eating and drinking regularly  Still swimming .    Past Medical History:  Diagnosis Date  . Aortic dissection (HCC)    type B aortic dissection in November 2008  . Hypertension   . Prostate cancer ([place])    PSA levels have decreased with garlic therapy  . Stroke [facility]) 1996   History of right vertebral artery dissection with stroke  . Thoracic aortic aneurysm [facility])     Past Surgical History:  Procedure Laterality Date  . arotic discection      2012   .  COLONOSCOPY  09/14/2011   Procedure: COLONOSCOPY;  Surgeon: Beryle Beams, MD;  Location: [place];  Service: Endoscopy;  Laterality: N/A;  . COLONOSCOPY WITH PROPOFOL N/A 07/01/2015   Procedure: COLONOSCOPY WITH PROPOFOL;  Surgeon: Carol Ada, MD;  [facility];  Service: Endoscopy;  Laterality: N/A;  . EYE SURGERY     catract     Current Outpatient Medications  Medication Sig Dispense Refill  . ascorbic acid (VITAMIN C) 1000 MG tablet Take 1,000 mg by mouth 2 (two) times daily.     Marland Kitchen aspirin 81 MG tablet Take 81 mg by mouth at bedtime.     Marland Kitchen atorvastatin (LIPITOR) 20 MG tablet Take 1 tablet by mouth everyday at bedtime 90 tablet 3  . Cholecalciferol (VITAMIN D-3 PO) Take 5,000 mg by mouth daily.     . Choline Fenofibrate (FENOFIBRIC ACID) 135 MG CPDR Take 135 mg by mouth daily. 90 capsule 3  . esomeprazole (NEXIUM) 40 MG capsule Take 40 mg by mouth daily before breakfast.      . finasteride (PROSCAR) 5 MG tablet Take 5 mg by mouth daily.    . fish oil-omega-3 fatty acids 1000 MG capsule Take 1,000 mg by mouth daily.     Marland Kitchen FLUZONE HIGH-DOSE 0.5 ML SUSY Inject as directed once.    . labetalol (NORMODYNE) 100 MG tablet Take 1 tablet (100 mg total) by mouth 2 (two) times daily. 180 tablet 3  . latanoprost (XALATAN) 0.005 % ophthalmic solution Place 1 drop into both eyes at bedtime.     . Multiple Vitamin (MULITIVITAMIN WITH MINERALS) TABS Take 1 tablet by mouth daily.    . polyvinyl alcohol (LIQUIFILM TEARS) 1.4 % ophthalmic solution Place 1 drop into both eyes every 8 (eight) hours as needed for dry eyes.    . sildenafil (VIAGRA) 100 MG tablet Take 1 tablet (100 mg total) by mouth daily as needed for erectile dysfunction. 10 tablet 6  . VITAMIN E PO Take 1,000 Units by mouth daily.      No current facility-administered medications for this visit.     Allergies:   Patient has no known allergies.    Social History:  The patient  reports that he quit smoking about 44 years  ago. His smoking use included cigarettes. He has a 30.00 pack-year smoking history. He has never used smokeless tobacco. He reports that  he drinks about 4.0 standard drinks of alcohol per week. He reports that he does not use drugs.   Family History:  The patient's family history is not on file.    ROS:   Noted in current hx    Physical Exam: Blood pressure (!) 96/46, pulse 74, height 5\' 4"  (1.626 m), weight 134 lb (60.8 kg), SpO2 96 %.  GEN:  Well nourished, well developed in no acute distress HEENT: Normal NECK: No JVD; No carotid bruits LYMPHATICS: No lymphadenopathy CARDIAC: RRR, no murmurs, rubs, gallops RESPIRATORY:  Clear to auscultation without rales, wheezing or rhonchi  ABDOMEN: Soft, non-tender, non-distended MUSCULOSKELETAL:  No edema; No deformity  SKIN: Warm and dry NEUROLOGIC:  Alert and oriented x 3   EKG:         Recent Labs: No results found for requested labs within last 8760 hours.    Lipid Panel    Component Value Date/Time   CHOL 126 12/24/2016 1621   TRIG 47 12/24/2016 1621   HDL 61 12/24/2016 1621   CHOLHDL 2.1 12/24/2016 1621   CHOLHDL 1.9 11/24/2015 1541   VLDL 11 11/24/2015 1541   LDLCALC 56 12/24/2016 1621      Wt Readings from Last 3 Encounters:  07/14/18 134 lb (60.8 kg)  01/15/18 138 lb 6.4 oz (62.8 kg)  12/26/16 137 lb 6.4 oz (62.3 kg)      Other studies Reviewed: Additional studies/ records that were reviewed today include: . Review of the above records demonstrates:   ECG :      ASSESSMENT AND PLAN:  1. Type III aortic dissection -he has a type III aortic dissection.    Seems to be doing great . BP is well controlled.   2. Hypertension  - well controlled   3. Prostate cancer -    Current medicines are reviewed at length with the patient today.  The patient does not have concerns regarding medicines.  The following changes have been made:  no change   Disposition:   FU with me in 6 months .      Signed, [name], MD  07/14/2018 2:04 PM    [facility] [address], [place], [place]  [address] Phone: [phone]; Fax: [phone]

## 2018-10-13 ENCOUNTER — Encounter: Payer: Self-pay | Admitting: Cardiovascular Disease

## 2018-10-13 NOTE — Telephone Encounter (Signed)
error 

## 2018-10-23 ENCOUNTER — Other Ambulatory Visit: Payer: Self-pay | Admitting: *Deleted

## 2018-10-23 DIAGNOSIS — I712 Thoracic aortic aneurysm, without rupture, unspecified: Secondary | ICD-10-CM

## 2018-10-23 NOTE — Progress Notes (Unsigned)
Ct a 

## 2018-11-17 ENCOUNTER — Ambulatory Visit: Payer: Medicare Other | Admitting: Thoracic Surgery (Cardiothoracic Vascular Surgery)

## 2018-12-15 ENCOUNTER — Inpatient Hospital Stay: Admission: RE | Admit: 2018-12-15 | Payer: Medicare Other | Source: Ambulatory Visit

## 2018-12-15 ENCOUNTER — Ambulatory Visit: Payer: Medicare Other | Admitting: Thoracic Surgery (Cardiothoracic Vascular Surgery)

## 2018-12-29 ENCOUNTER — Telehealth: Payer: Self-pay | Admitting: Cardiovascular Disease

## 2018-12-29 DIAGNOSIS — N400 Enlarged prostate without lower urinary tract symptoms: Secondary | ICD-10-CM

## 2018-12-29 NOTE — Telephone Encounter (Signed)
Left detailed message on patient's mobile number to call back regarding lab work. I advised that we will need to reschedule lab appointment due to Covid restrictions.

## 2018-12-29 NOTE — Telephone Encounter (Signed)
New Message:    Pt iwants Dwayne Morris to know that he was unable to has his physical this year. His Medical Doctor is very sick. He wants to know if when he has his lab work next week, if you would include PSA and his Sugar Level pleases?

## 2018-12-30 NOTE — Telephone Encounter (Signed)
Spoke with patient and advised him that we will postpone lab work to July due to Covid 19 restrictions. He is scheduled for tele visit with Dr. Acie Fredrickson on 5/28. I advised that we will add PSA to patient's lab orders per his request. He thanked me for the call.

## 2019-01-05 ENCOUNTER — Other Ambulatory Visit: Payer: Medicare Other

## 2019-01-09 ENCOUNTER — Other Ambulatory Visit: Payer: Self-pay | Admitting: Cardiovascular Disease

## 2019-01-12 ENCOUNTER — Other Ambulatory Visit: Payer: Medicare Other

## 2019-01-16 ENCOUNTER — Telehealth: Payer: Self-pay

## 2019-01-16 NOTE — Telephone Encounter (Addendum)
Attempted to contact pt about upcoming appt and obtain verbal consent. Left message for pt to call back. Due to scheduling changes we are moving his appt.

## 2019-01-22 ENCOUNTER — Other Ambulatory Visit: Payer: Self-pay

## 2019-01-22 ENCOUNTER — Ambulatory Visit: Payer: Medicare Other | Admitting: Cardiovascular Disease

## 2019-01-22 ENCOUNTER — Encounter: Payer: Self-pay | Admitting: Cardiovascular Disease

## 2019-01-22 ENCOUNTER — Telehealth (INDEPENDENT_AMBULATORY_CARE_PROVIDER_SITE_OTHER): Payer: Medicare Other | Admitting: Cardiovascular Disease

## 2019-01-22 VITALS — BP 117/62 | HR 66 | Ht 64.0 in

## 2019-01-22 DIAGNOSIS — I7101 Dissection of thoracic aorta: Secondary | ICD-10-CM

## 2019-01-22 DIAGNOSIS — E782 Mixed hyperlipidemia: Secondary | ICD-10-CM | POA: Diagnosis not present

## 2019-01-22 DIAGNOSIS — E785 Hyperlipidemia, unspecified: Secondary | ICD-10-CM | POA: Insufficient documentation

## 2019-01-22 DIAGNOSIS — I10 Essential (primary) hypertension: Secondary | ICD-10-CM

## 2019-01-22 DIAGNOSIS — I711 Thoracic aortic aneurysm, ruptured, unspecified: Secondary | ICD-10-CM

## 2019-01-22 DIAGNOSIS — Z7189 Other specified counseling: Secondary | ICD-10-CM

## 2019-01-22 DIAGNOSIS — I71019 Dissection of thoracic aorta, unspecified: Secondary | ICD-10-CM

## 2019-01-22 NOTE — Patient Instructions (Addendum)
Medication Instructions:  Your physician recommends that you continue on your current medications as directed. Please refer to the Current Medication list given to you today.  If you need a refill on your cardiac medications before your next appointment, please call your pharmacy.     Lab work: Your physician recommends that you return for lab work on Thursday August 13 at 11:00 am  You will need to FAST for this appointment - nothing to eat or drink after midnight the night before except water.      Testing/Procedures: None Ordered   Follow-Up: At [facility], you and your health needs are our priority.  As part of our continuing mission to provide you with exceptional heart care, we have created designated Provider Care Teams.  These Care Teams include your primary Cardiologist (physician) and Advanced Practice Providers (APPs -  Physician Assistants and Nurse Practitioners) who all work together to provide you with the care you need, when you need it. You will need a follow up appointment in:  6 months.  Please call our office 2 months in advance to schedule this appointment.  You may see [name], MD or one of the following Advanced Practice Providers on your designated Care Team: Richardson Dopp, PA-C [place], [place] . Daune Perch, NP

## 2019-01-22 NOTE — Progress Notes (Addendum)
Virtual Visit via Telephone Note   This visit type was conducted due to national recommendations for restrictions regarding the COVID-19 Pandemic (e.g. social distancing) in an effort to limit this patient's exposure and mitigate transmission in our community.  Due to his co-morbid illnesses, this patient is at least at moderate risk for complications without adequate follow up.  This format is felt to be most appropriate for this patient at this time.  The patient did not have access to video technology/had technical difficulties with video requiring transitioning to audio format only (telephone).  All issues noted in this document were discussed and addressed.  No physical exam could be performed with this format.  Please refer to the patient's chart for his  consent to telehealth for [facility].   Date:  01/22/2019   ID:  Dwayne Morris, DOB May 04, 1927, MRN [id number]  Patient Location: Home Provider Location: Home  PCP:  [name], MD  Cardiologist:  [name], MD  Electrophysiologist:  None   1. Type III aortic dissection 2. Hypertension 3. Prostate cancer 4. 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  gentleman originally from [place]. He is done quite well since I last saw him 6 months ago. [name] not had any problems with his blood pressure. [name] been taking his medications the records his blood pressure on a regular basis.  He has a history of an aortic dissection. [name] not had any further episodes of chest pain. We have followed him with CT scans and his most recent CT scans have not shown any worsening of his dissection.  February 24, 2013:  DK is doing well. Still swiimming regularly. No CP , no dyspnea. His CT angio of the aorta which was unremarkable. He has been found to have a left inguinal hernia.   Jan. 12, 2015:  DK is doing well. He has been to [place] since last saw him. Still swimming reguarly.   April 14, 2014:  Doing well. No CP or issues. Still swimming regulalry.  He has noticed that on occasion is lower than 100 - he typically holds his labetalol .  The following morning the blood pressures usually normal.   Feb. 18, 2016:   LUISCARLOS Morris is a 85 y.o. male who presents for follow up of his aortic dissection.  His home BP readings are all good.  He swims 1/2 mile a day 3 days a week.  Walks quite a bit.  Also is active in his gardent  Has some occasional back pain if he works in his garden  Sept. 8, 2016:  Doing well.  Has some "stuffiness in his chest "  ,  Seems to be more pronounced if he is stress.  Goes away when he walks around the block   We discussed his vertebral artery dissection that occurred 20 years ago .  was hospitalized . Still has some left sided weakness adn "stiffness"   His son , Dwayne Morris, noticed that he occasionally has some facial drooping . He has discussed with Dr. Maudie Mercury.   Dr. Maudie Mercury suggest   Brought his BP log.  All his readings are normal  Oct. 25, 2016:  DK is seen today for some chest discomfort. Chest pressure , minor Feels better after swimming or walking . Not worsened with deep breath ( in fact, makes it feel better ) Not worsened with arm movement  calastinics seem to help  Mostly on the left side.  Not associated with sweats or dyspnea.  Is scheduled to have colonoscopy on Nov. 4 ( Dr. Benson Norway)  [facility] to hold his ASA for a week prior to colonoscopy . Has had polys removed in the past - complicated by a lower GI bleed. Apparently a clip had come loose.   December 20, 2015:  DK is doing well .  BP is stable.   Brought his BP log.  Swimming regularly   Oct. 23, 2017: Feeling well Still swimming 3 times a week - 1/2 mile .  Also walks 3 miles 3 times a week.  Brought his BP log .  Dec 26, 2016: DK is doing well Swims regularly .   Walks 3 miles a day also  No CP or dyspnea.  BP records were reviewed .  Was seen by Dr. Roxy Manns .   CT at that time showed that the dissection was completely healed.   Jan 15, 2018:   Doing well Shared recent news about his son Dwayne Morris. BP is great. Brought his BP log  Shared a bottle of  Merlot with me   Nov. 18, 2019:    Doing great We disscussed his son's recent achievements  Feels well  No further CP or abd pain  Eating and drinking regularly  Still swimming .    Evaluation Performed:  Follow-Up Visit  Chief Complaint:  Follow up aortic dissection and HTN   Jan 22, 2019   Dwayne Morris is a 85 y.o. male with hx of aortic dissection, HTN, hyperlipidemia  Seems to be doing well. BP has been well controlled.  Denies any CP or dyspnea  Walks daily,  Is not swimming due to covid pandemic  Has seen Dr. Roxy Manns for follow up of his aortic dissection as well as a CT scan of aortia .  He has done well .      Staying at home, Doing lots of yard work .  The patient does not have symptoms concerning for COVID-19 infection (fever, chills, cough, or new shortness of breath).    Past Medical History:  Diagnosis Date   Aortic dissection ([place])    type B aortic dissection in November 2008   Hypertension    Prostate cancer [facility])    PSA levels have decreased with garlic therapy   Stroke [facility]) 1996   History of right vertebral artery dissection with stroke   Thoracic aortic aneurysm [facility])    Past Surgical History:  Procedure Laterality Date   arotic discection      2012    COLONOSCOPY  09/14/2011   Procedure: COLONOSCOPY;  Surgeon: Beryle Beams, MD;  Location: [place];  Service: Endoscopy;  Laterality: N/A;   COLONOSCOPY WITH PROPOFOL N/A 07/01/2015   Procedure: COLONOSCOPY WITH PROPOFOL;  Surgeon: Carol Ada, MD;  Location: [facility];  Service: Endoscopy;  Laterality: N/A;   EYE SURGERY     catract     Current Meds  Medication Sig   ascorbic acid (VITAMIN C) 1000 MG tablet Take 2,000 mg by mouth 3 (three) times daily.    aspirin 81 MG tablet  Take 81 mg by mouth at bedtime.    atorvastatin (LIPITOR) 20 MG tablet Take 1 tablet by mouth everyday at bedtime   Cholecalciferol (VITAMIN D-3 PO) Take 5,000 mg by mouth daily.    Choline Fenofibrate (FENOFIBRIC ACID) 135 MG CPDR TAKE 1 CAPSULE BY MOUTH EVERY DAY   esomeprazole (NEXIUM) 40 MG capsule  Take 40 mg by mouth daily before breakfast.     finasteride (PROSCAR) 5 MG tablet Take 5 mg by mouth daily.   fish oil-omega-3 fatty acids 1000 MG capsule Take 1,000 mg by mouth daily.    FLUZONE HIGH-DOSE 0.5 ML SUSY Inject as directed once.   labetalol (NORMODYNE) 100 MG tablet TAKE 1 TABLET BY MOUTH TWICE A DAY   latanoprost (XALATAN) 0.005 % ophthalmic solution Place 1 drop into both eyes at bedtime.    Multiple Vitamin (MULITIVITAMIN WITH MINERALS) TABS Take 1 tablet by mouth daily.   polyvinyl alcohol (LIQUIFILM TEARS) 1.4 % ophthalmic solution Place 1 drop into both eyes every 8 (eight) hours as needed for dry eyes.   VITAMIN E PO Take 400 Units by mouth daily.      Allergies:   Patient has no known allergies.   Social History   Tobacco Use   Smoking status: Former Smoker    Packs/day: 1.00    Years: 30.00    Pack years: 30.00    Types: Cigarettes    Last attempt to quit: 05/15/1974    Years since quitting: 44.7   Smokeless tobacco: Never Used  Substance Use Topics   Alcohol use: Yes    Alcohol/week: 4.0 standard drinks    Types: 4 Glasses of wine per week   Drug use: No     Family Hx: The patient's family history is not on file.  ROS:   Please see the history of present illness.     All other systems reviewed and are negative.   Prior CV studies:   The following studies were reviewed today:    Labs/Other Tests and Data Reviewed:    EKG:  No ECG reviewed.  Recent Labs: No results found for requested labs within last 8760 hours.   Recent Lipid Panel Lab Results  Component Value Date/Time   CHOL 126 12/24/2016 04:21 PM   TRIG 47 12/24/2016  04:21 PM   HDL 61 12/24/2016 04:21 PM   CHOLHDL 2.1 12/24/2016 04:21 PM   CHOLHDL 1.9 11/24/2015 03:41 PM   LDLCALC 56 12/24/2016 04:21 PM    Wt Readings from Last 3 Encounters:  07/14/18 134 lb (60.8 kg)  01/15/18 138 lb 6.4 oz (62.8 kg)  12/26/16 137 lb 6.4 oz (62.3 kg)     Objective:    Vital Signs:  BP 117/62 (BP Location: Left Arm, Patient Position: Sitting, Cuff Size: Normal)    Pulse 66    Ht 5\' 4"  (1.626 m)    BMI 23.00 kg/m    No exam except for VS   ASSESSMENT & PLAN:    1.  Aortic dissection:   Type III .  Stable,   Has been followed also by Dr. Roxy Manns.  BP has been very stable.  No CP .   He has an appt for a CT scan and an appt with Dr. Roxy Manns soon.   He would rather not come in for an appt at this time.   I told him that I think he can postpone this visit and CT scan for a month or so but he should check with Dr. Roxy Manns  2.  HTN:   Stable ,  Continue current meds.   3.  Hyperlipidemia:   Fasting labs in 6 months , continue atorvastatin   I'll see him again in 6 months .    COVID-19 Education: The signs and symptoms of COVID-19 were discussed with the patient and how to seek care  for testing (follow up with PCP or arrange E-visit).  The importance of social distancing was discussed today.  Time:   Today, I have spent  25  minutes with the patient with telehealth technology discussing the above problems.     Medication Adjustments/Labs and Tests Ordered: Current medicines are reviewed at length with the patient today.  Concerns regarding medicines are outlined above.   Tests Ordered: No orders of the defined types were placed in this encounter.   Medication Changes: No orders of the defined types were placed in this encounter.   Disposition:  Follow up in 6 month(s)  Signed, [name], MD  01/22/2019 10:36 AM    [facility]

## 2019-01-26 ENCOUNTER — Other Ambulatory Visit: Payer: Self-pay

## 2019-01-26 ENCOUNTER — Ambulatory Visit
Admission: RE | Admit: 2019-01-26 | Discharge: 2019-01-26 | Disposition: A | Discharge status: Home / Self Care | Payer: Medicare Other | Source: Ambulatory Visit | Attending: Thoracic Surgery (Cardiothoracic Vascular Surgery) | Admitting: Thoracic Surgery (Cardiothoracic Vascular Surgery)

## 2019-01-26 ENCOUNTER — Ambulatory Visit: Payer: Medicare Other | Admitting: Thoracic Surgery (Cardiothoracic Vascular Surgery)

## 2019-01-26 ENCOUNTER — Encounter: Payer: Self-pay | Admitting: Thoracic Surgery (Cardiothoracic Vascular Surgery)

## 2019-01-26 VITALS — BP 116/67 | HR 66 | Temp 97.3°F | Resp 16 | Ht 64.0 in | Wt 133.0 lb

## 2019-01-26 DIAGNOSIS — I712 Thoracic aortic aneurysm, without rupture, unspecified: Secondary | ICD-10-CM

## 2019-01-26 DIAGNOSIS — I71019 Dissection of thoracic aorta, unspecified: Secondary | ICD-10-CM

## 2019-01-26 DIAGNOSIS — I7101 Dissection of thoracic aorta: Secondary | ICD-10-CM

## 2019-01-26 DIAGNOSIS — I7122 Aneurysm of the aortic arch, without rupture: Secondary | ICD-10-CM

## 2019-01-26 MED ORDER — IOPAMIDOL (ISOVUE-370) INJECTION 76%
75.0000 mL | Freq: Once | INTRAVENOUS | Status: AC | PRN
  Administered 2019-01-26: 75 mL via INTRAVENOUS

## 2019-01-26 NOTE — Patient Instructions (Signed)
Continue all previous medications without any changes at this time  Monitor your blood pressure regularly and discuss whether or not your blood pressure medications should be adjusted with your cardiologist and/or primary care physician, particularly if your systolic blood pressure consistently measures > 140 mmHg and/or your diastolic blood pressure measures > 90 mmHg

## 2019-01-26 NOTE — Progress Notes (Signed)
[address]       [place] [address]             [phone]     CARDIOTHORACIC SURGERY OFFICE NOTE  Primary Cardiologist is [name], MD PCP is [name], MD   HPI:  Patient is a 85 year old male who returns the office today for follow-up and surveillance of a sending thoracic aortic aneurysm.  He was originally seen in consultation in 2008 when he presented with an acute type B aortic dissection.  The false lumen of the descending thoracic aorta eventually thrombosed and healed completely.  We have been following him since then with serial CT angiography because of moderate aneurysmal enlargement of the ascending thoracic aorta and transverse aortic arch.  He was last seen here in our office on December 24, 2016.  Since then he has remained remarkably well physically.  He follows up intermittently with Dr. Acie Fredrickson.  He exercises on a regular basis.  He returns her office today and he reports no new medical problems or complaints.  He specifically denies any history of chest pain or chest tightness either with activity or at rest.  He reports no shortness of breath.  He is doing remarkably well for his age.   Current Outpatient Medications  Medication Sig Dispense Refill  . ascorbic acid (VITAMIN C) 1000 MG tablet Take 2,000 mg by mouth 3 (three) times daily.     Marland Kitchen aspirin 81 MG tablet Take 81 mg by mouth at bedtime.     Marland Kitchen atorvastatin (LIPITOR) 20 MG tablet Take 1 tablet by mouth everyday at bedtime 90 tablet 3  . Cholecalciferol (VITAMIN D-3 PO) Take 5,000 mg by mouth daily.     . Choline Fenofibrate (FENOFIBRIC ACID) 135 MG CPDR TAKE 1 CAPSULE BY MOUTH EVERY DAY 90 capsule 2  . esomeprazole (NEXIUM) 40 MG capsule Take 40 mg by mouth daily before breakfast.      . finasteride (PROSCAR) 5 MG tablet Take 5 mg by mouth daily.    . fish oil-omega-3 fatty acids 1000 MG capsule Take 1,000 mg by mouth daily.     Marland Kitchen FLUZONE HIGH-DOSE 0.5 ML SUSY Inject as directed  once.    . labetalol (NORMODYNE) 100 MG tablet TAKE 1 TABLET BY MOUTH TWICE A DAY 180 tablet 2  . latanoprost (XALATAN) 0.005 % ophthalmic solution Place 1 drop into both eyes at bedtime.     . Multiple Vitamin (MULITIVITAMIN WITH MINERALS) TABS Take 1 tablet by mouth daily.    . polyvinyl alcohol (LIQUIFILM TEARS) 1.4 % ophthalmic solution Place 1 drop into both eyes every 8 (eight) hours as needed for dry eyes.    Marland Kitchen VITAMIN E PO Take 400 Units by mouth daily.      No current facility-administered medications for this visit.       Physical Exam:   BP 116/67 (BP Location: Right Arm, Patient Position: Sitting, Cuff Size: Normal)   Pulse 66   Temp (!) 97.3 F (36.3 C) (Skin)   Resp 16   Ht 5\' 4"  (1.626 m)   Wt 133 lb (60.3 kg)   SpO2 96% Comment: ON RA  BMI 22.83 kg/m   General:  Well-appearing  Chest:   Clear to auscultation  CV:   Regular rate and rhythm without murmur  Incisions:  n/a  Abdomen:  Soft nontender  Extremities:  Warm and well-perfused  Diagnostic Tests:  CT ANGIOGRAPHY CHEST WITH CONTRAST  TECHNIQUE: Multidetector  CT imaging of the chest was performed using the standard protocol during bolus administration of intravenous contrast. Multiplanar CT image reconstructions and MIPs were obtained to evaluate the vascular anatomy.  CONTRAST:  34mL ISOVUE-370 IOPAMIDOL (ISOVUE-370) INJECTION 76%  COMPARISON:  December 24, 2016  FINDINGS: Cardiovascular: Ascending thoracic aortic diameter measures 4.9 x 4.6 cm, essentially stable compared to the previous study. The measured diameter of the aorta at the arch is 4.0 cm, essentially stable. The measured diameter at the sinotubular junction measured on the coronal reformatted images is 4.6 cm. The descending thoracic aorta at the level of the main pulmonary outflow tract has a maximum diameter of 3.0 x 3.0 cm. No dissection is evident. There are foci of calcification in the visualized great vessels, particularly  in the left subclavian artery. There is aortic atherosclerosis. There is no pericardial effusion or pericardial thickening. There is no demonstrable pulmonary embolus.  Mediastinum/Nodes: Thyroid appears unremarkable. There is no thoracic adenopathy. No esophageal lesions are demonstrable.  Lungs/Pleura: There is mild bullous disease in the left lower lobe. There is bibasilar atelectasis. There is also atelectatic change in the inferior lingula. There is no edema or consolidation. There is no evident pleural effusion or pleural thickening.  Upper Abdomen: There is atherosclerotic calcification in the upper abdominal aorta. A small portion of a mass arising from the upper pole left kidney is noted; previous CT demonstrated a cyst in this area. Visualized upper abdominal structures otherwise appear unremarkable.  Musculoskeletal: There is degenerative change in the thoracic spine with diffuse idiopathic skeletal hyperostosis in the lower thoracic spine. There are no blastic or lytic bone lesions evident. There are no chest wall lesions.  Review of the MIP images confirms the above findings.  IMPRESSION: 1. Stable ascending thoracic aortic diameter measuring 4.9 x 4.6 cm. Stable arch diameter measurement measuring 4.0 cm. No dissection evident. Ascending thoracic aortic aneurysm. Recommend semi-annual imaging followup by CTA or MRA and referral to cardiothoracic surgery if not already obtained. This recommendation follows 2010 ACCF/AHA/AATS/ACR/ASA/SCA/SCAI/SIR/STS/SVM Guidelines for the Diagnosis and Management of Patients With Thoracic Aortic Disease. Circulation. 2010; 121: F790-W409. Aortic aneurysm NOS (ICD10-I71.9). There are foci of aortic and great vessel atherosclerosis.  2.  No demonstrable pulmonary embolus.  3. Areas of atelectatic change bilaterally. No frank edema or consolidation.  4.  No appreciable thoracic adenopathy.  5.  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  Aortic aneurysm NOS (ICD10-I71.9).   Electronically Signed   By: Lowella Grip III M.D.   On: 01/26/2019 10:28   Impression:  I have personally reviewed the patient's follow-up CT angiogram performed earlier today.  He has stable moderate aneurysmal enlargement of the ascending thoracic aorta and transverse aortic arch.  There has been no interval change in size of his thoracic aorta over the last several years.  Clinically the patient is doing remarkably well.  Plan:  We have not recommended any change the patient's current medications.  I have discussed whether or not to continue with follow-up surveillance, and the patient prefers to continue to get CT imaging every 2 years.  All of his questions have been addressed.  I spent in excess of 15 minutes during the conduct of this office consultation and >50% of this time involved direct face-to-face encounter with the patient for counseling and/or coordination of their care.    Valentina Gu. Roxy Manns, MD 01/26/2019 1:29 PM

## 2019-02-19 ENCOUNTER — Telehealth: Payer: Self-pay | Admitting: Cardiovascular Disease

## 2019-02-19 NOTE — Telephone Encounter (Signed)
I spoke to the patient who is concerned about his BP fluctuating.  He has been taking it too frequently, every 2 minutes apart and the values are variable.  I advised him to take his morning medication (labetolol) at 11 am and check BP @ 1 pm.  He then should take his evening dose at 11 pm and check BP @ 1 am.  The patient stays up until 2-3 am.  He will call us on Friday afternoon and give an update of his progress.

## 2019-02-19 NOTE — Telephone Encounter (Signed)
Patient would like a call back, he has an urgent matter he has to talk to dr about.  Patient would not give any further information.

## 2019-03-05 ENCOUNTER — Other Ambulatory Visit: Payer: Medicare Other

## 2019-04-09 ENCOUNTER — Other Ambulatory Visit: Payer: Medicare Other

## 2019-07-13 ENCOUNTER — Other Ambulatory Visit: Payer: Self-pay | Admitting: Cardiovascular Disease

## 2019-09-13 ENCOUNTER — Ambulatory Visit: Payer: Medicare PPO | Attending: Internal Medicine

## 2019-09-13 DIAGNOSIS — Z23 Encounter for immunization: Secondary | ICD-10-CM

## 2019-09-13 NOTE — Progress Notes (Signed)
   Covid-19 Vaccination Clinic  Name:  Dwayne Morris    MRN: [id number] DOB: [birth year]  09/13/2019  [name] was observed post Covid-19 immunization for 15 minutes without incidence. He was provided with Vaccine Information Sheet and instruction to access the V-Safe system.   [name] was instructed to call 911 with any severe reactions post vaccine: Marland Kitchen Difficulty breathing  . Swelling of your face and throat  . A fast heartbeat  . A bad rash all over your body  . Dizziness and weakness   Patient was discharged at 1:02.

## 2019-09-29 ENCOUNTER — Other Ambulatory Visit: Payer: Self-pay | Admitting: Cardiovascular Disease

## 2019-10-01 ENCOUNTER — Ambulatory Visit: Payer: Medicare PPO | Attending: Internal Medicine

## 2019-10-01 DIAGNOSIS — Z23 Encounter for immunization: Secondary | ICD-10-CM

## 2019-10-01 NOTE — Progress Notes (Signed)
   Covid-19 Vaccination Clinic  Name:  Dwayne Morris    MRN: [id number] DOB: [birth year]  10/01/2019  Dwayne Morris was observed post Covid-19 immunization for 15 minutes without incidence. He was provided with Vaccine Information Sheet and instruction to access the V-Safe system.   Dwayne Morris was instructed to call 911 with any severe reactions post vaccine: Marland Kitchen Difficulty breathing  . Swelling of your face and throat  . A fast heartbeat  . A bad rash all over your body  . Dizziness and weakness    Immunizations Administered    Name Date Dose VIS Date Route   Pfizer COVID-19 Vaccine 10/01/2019  1:00 PM 0.3 mL 08/07/2019 Intramuscular   Manufacturer: [place]   Lot: [id number]   [place]: [id number]

## 2019-10-09 ENCOUNTER — Other Ambulatory Visit: Payer: Self-pay | Admitting: Cardiovascular Disease

## 2019-11-17 ENCOUNTER — Encounter: Payer: Self-pay | Admitting: Cardiovascular Disease

## 2019-11-17 ENCOUNTER — Ambulatory Visit: Payer: Medicare PPO | Admitting: Cardiovascular Disease

## 2019-11-17 ENCOUNTER — Other Ambulatory Visit: Payer: Self-pay

## 2019-11-17 VITALS — BP 134/64 | HR 72 | Ht 64.0 in | Wt 134.5 lb

## 2019-11-17 DIAGNOSIS — I10 Essential (primary) hypertension: Secondary | ICD-10-CM

## 2019-11-17 DIAGNOSIS — E782 Mixed hyperlipidemia: Secondary | ICD-10-CM

## 2019-11-17 DIAGNOSIS — I7101 Dissection of thoracic aorta: Secondary | ICD-10-CM | POA: Diagnosis not present

## 2019-11-17 DIAGNOSIS — I71019 Dissection of thoracic aorta, unspecified: Secondary | ICD-10-CM

## 2019-11-17 NOTE — Patient Instructions (Signed)
Medication Instructions:  Your physician recommends that you continue on your current medications as directed. Please refer to the Current Medication list given to you today.  *If you need a refill on your cardiac medications before your next appointment, please call your pharmacy*   Lab Work: None Ordered If you have labs (blood work) drawn today and your tests are completely normal, you will receive your results only by: . MyChart Message (if you have MyChart) OR . A paper copy in the mail If you have any lab test that is abnormal or we need to change your treatment, we will call you to review the results.   Testing/Procedures: None Ordered   Follow-Up: At [facility], you and your health needs are our priority.  As part of our continuing mission to provide you with exceptional heart care, we have created designated Provider Care Teams.  These Care Teams include your primary Cardiologist (physician) and Advanced Practice Providers (APPs -  Physician Assistants and Nurse Practitioners) who all work together to provide you with the care you need, when you need it.  We recommend signing up for the patient portal called "MyChart".  Sign up information is provided on this After Visit Summary.  MyChart is used to connect with patients for Virtual Visits (Telemedicine).  Patients are able to view lab/test results, encounter notes, upcoming appointments, etc.  Non-urgent messages can be sent to your provider as well.   To learn more about what you can do with MyChart, go to [email or web address].    Your next appointment:   6 month(s)  The format for your next appointment:   Either In Person or Virtual  Provider:   You may see Philip Nahser, MD or one of the following Advanced Practice Providers on your designated Care Team:    Scott Weaver, PA-C  Vin Bhagat, PA-C  Janine Hammond, NP     

## 2019-11-17 NOTE — Progress Notes (Signed)
Cardiology Office Note   Date:  11/17/2019   ID:  [name], DOB 1927-07-29, MRN [id number]  PCP:  [name], MD  Cardiologist:   [name], MD   Chief Complaint  Patient presents with  . Thoracic Aortic Dissection   1. Type III aortic dissection 2. Hypertension 3. Prostate cancer 4. 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  gentleman originally from [place]. He is done quite well since I last saw him 6 months ago. [name] not had any problems with his blood pressure. [name] been taking his medications the records his blood pressure on a regular basis.  He has a history of an aortic dissection. [name] not had any further episodes of chest pain. We have followed him with CT scans and his most recent CT scans have not shown any worsening of his dissection.  February 24, 2013:  Dwayne Morris is doing well. Still swiimming regularly. No CP , no dyspnea. His CT angio of the aorta which was unremarkable. He has been found to have a left inguinal hernia.   Jan. 12, 2015:  Dwayne Morris is doing well. He has been to [place] since last saw him. Still swimming reguarly.   April 14, 2014:  Doing well. No CP or issues. Still swimming regulalry.  He has noticed that on occasion is lower than 100 - he typically holds his labetalol .  The following morning the blood pressures usually normal.   Feb. 18, 2016:   Dwayne Morris is a 86 y.o. male who presents for follow up of his aortic dissection.  His home BP readings are all good.  He swims 1/2 mile a day 3 days a week.  Walks quite a bit.  Also is active in his gardent  Has some occasional back pain if he works in his garden  Sept. 8, 2016:  Doing well.  Has some "stuffiness in his chest "  ,  Seems to be more pronounced if he is stress.  Goes away when he walks around the block   We discussed his vertebral artery dissection that occurred 20 years ago .  was hospitalized . Still has some left sided weakness  adn "stiffness"   His son , Dwayne Morris, noticed that he occasionally has some facial drooping . He has discussed with Dr. Maudie Mercury.   Dr. Maudie Mercury suggest   Brought his BP log.  All his readings are normal  Oct. 25, 2016:  Dwayne Morris is seen today for some chest discomfort. Chest pressure , minor Feels better after swimming or walking . Not worsened with deep breath ( in fact, makes it feel better ) Not worsened with arm movement  calastinics seem to help   Mostly on the left side.  Not associated with sweats or dyspnea.  Is scheduled to have colonoscopy on Nov. 4 ( Dr. Benson Norway)  [facility] to hold his ASA for a week prior to colonoscopy . Has had polys removed in the past - complicated by a lower GI bleed. Apparently a clip had come loose.   December 20, 2015:  Dwayne Morris is doing well .  BP is stable.   Brought his BP log.  Swimming regularly   Oct. 23, 2017: Feeling well Still swimming 3 times a week - 1/2 mile .  Also walks 3 miles 3 times a week.  Brought his BP log .  Dec 26, 2016: Dwayne Morris is doing well Swims regularly .  Walks 3 miles a day also  No CP or dyspnea.  BP records were reviewed .  Was seen by Dr. Roxy Manns .  CT at that time showed that the dissection was completely healed.   Jan 15, 2018:   Doing well Shared recent news about his son Dwayne Morris. BP is great. Brought his BP log  Shared a bottle of  Merlot with me   Nov. 18, 2019:    Doing great We disscussed his son's recent achievements  Feels well  No further CP or abd pain  Eating and drinking regularly  Still swimming .   November 17, 2019:  Dwayne Morris is doing well.  Is no longer swimming,   Walks  3 miles a day . Is seeing Dr. Roxy Manns every 2 years .  Brought his BP log  Has had both covid vaccines.    Past Medical History:  Diagnosis Date  . Aortic dissection (HCC)    type B aortic dissection in November 2008  . Hypertension   . Prostate cancer ([place])    PSA levels have decreased with garlic therapy  . Stroke [facility]) 1996   History of right  vertebral artery dissection with stroke  . Thoracic aortic aneurysm [facility])     Past Surgical History:  Procedure Laterality Date  . arotic discection      2012   . COLONOSCOPY  09/14/2011   Procedure: COLONOSCOPY;  Surgeon: Beryle Beams, MD;  Location: [facility];  Service: Endoscopy;  Laterality: N/A;  . COLONOSCOPY WITH PROPOFOL N/A 07/01/2015   Procedure: COLONOSCOPY WITH PROPOFOL;  Surgeon: Carol Ada, MD;  [facility];  Service: Endoscopy;  Laterality: N/A;  . EYE SURGERY     catract     Current Outpatient Medications  Medication Sig Dispense Refill  . ascorbic acid (VITAMIN C) 1000 MG tablet Take 2,000 mg by mouth 3 (three) times daily.     Marland Kitchen aspirin 81 MG tablet Take 81 mg by mouth at bedtime.     Marland Kitchen atorvastatin (LIPITOR) 20 MG tablet TAKE 1 TABLET BY MOUTH EVERYDAY AT BEDTIME 90 tablet 1  . Cholecalciferol (VITAMIN D-3 PO) Take 6,000 mg by mouth daily.     . Choline Fenofibrate (FENOFIBRIC ACID) 135 MG CPDR TAKE 1 CAPSULE BY MOUTH EVERY DAY 90 capsule 0  . COLLAGEN PO Take 6,000 mg by mouth daily.    Marland Kitchen esomeprazole (NEXIUM) 40 MG capsule Take 40 mg by mouth daily before breakfast.      . finasteride (PROSCAR) 5 MG tablet Take 5 mg by mouth daily.    . fish oil-omega-3 fatty acids 1000 MG capsule Take 1,000 mg by mouth daily.     Marland Kitchen labetalol (NORMODYNE) 100 MG tablet Take 1 tablet (100 mg total) by mouth 2 (two) times daily. Please keep upcoming appt for refills. Thank you 180 tablet 0  . latanoprost (XALATAN) 0.005 % ophthalmic solution Place 1 drop into both eyes at bedtime.     . Multiple Vitamin (MULITIVITAMIN WITH MINERALS) TABS Take 1 tablet by mouth daily.    . polyvinyl alcohol (LIQUIFILM TEARS) 1.4 % ophthalmic solution Place 1 drop into both eyes every 8 (eight) hours as needed for dry eyes.    Marland Kitchen QUERCETIN PO Take 1 tablet by mouth daily.    Marland Kitchen VITAMIN E PO Take 400 Units by mouth daily.      No current facility-administered medications for this visit.     Allergies:   Patient has no known allergies.  Social History:  The patient  reports that he quit smoking about 45 years ago. His smoking use included cigarettes. He has a 30.00 pack-year smoking history. He has never used smokeless tobacco. He reports current alcohol use of about 4.0 standard drinks of alcohol per week. He reports that he does not use drugs.   Family History:  The patient's family history is not on file.    ROS:   Noted in current hx    Physical Exam: Blood pressure 134/64, pulse 72, height 5\' 4"  (1.626 m), weight 134 lb 8 oz (61 kg), SpO2 97 %.  GEN:  Well nourished, well developed in no acute distress HEENT: Normal NECK: No JVD; No carotid bruits LYMPHATICS: No lymphadenopathy CARDIAC: RRR, no murmurs, rubs, gallops RESPIRATORY:  Clear to auscultation without rales, wheezing or rhonchi  ABDOMEN: Soft, non-tender, non-distended MUSCULOSKELETAL:  No edema; No deformity  SKIN: Warm and dry NEUROLOGIC:  Alert and oriented x 3   EKG:      November 17, 2019: Normal sinus rhythm at 62. No ST or T wave changes.   Recent Labs: No results found for requested labs within last 8760 hours.    Lipid Panel    Component Value Date/Time   CHOL 126 12/24/2016 1621   TRIG 47 12/24/2016 1621   HDL 61 12/24/2016 1621   CHOLHDL 2.1 12/24/2016 1621   CHOLHDL 1.9 11/24/2015 1541   VLDL 11 11/24/2015 1541   LDLCALC 56 12/24/2016 1621      Wt Readings from Last 3 Encounters:  11/17/19 134 lb 8 oz (61 kg)  01/26/19 133 lb (60.3 kg)  07/14/18 134 lb (60.8 kg)      Other studies Reviewed: Additional studies/ records that were reviewed today include: . Review of the above records demonstrates:   ECG :      ASSESSMENT AND PLAN:  1. Type III aortic dissection -he has a type III aortic dissection.    Patient is doing very well. Continue current medications. Blood pressure seems to be very well controlled.  2. Hypertension  - well controlled   3. Prostate  cancer -managed by Dr. Jeffie Pollock.  Current medicines are reviewed at length with the patient today.  The patient does not have concerns regarding medicines.  The following changes have been made:  no change   Disposition:   FU with me in 6 months .     Signed, [name], MD  11/17/2019 4:40 PM    [facility] [place], [place], [place]  [address] Phone: [phone]; Fax: [phone]

## 2019-12-31 ENCOUNTER — Other Ambulatory Visit: Payer: Self-pay | Admitting: Cardiovascular Disease

## 2020-01-03 ENCOUNTER — Other Ambulatory Visit: Payer: Self-pay | Admitting: Cardiovascular Disease

## 2020-01-08 ENCOUNTER — Other Ambulatory Visit: Payer: Self-pay | Admitting: *Deleted

## 2020-01-08 DIAGNOSIS — I712 Thoracic aortic aneurysm, without rupture, unspecified: Secondary | ICD-10-CM

## 2020-02-08 ENCOUNTER — Ambulatory Visit: Payer: Medicare PPO | Admitting: Thoracic Surgery (Cardiothoracic Vascular Surgery)

## 2020-02-08 ENCOUNTER — Other Ambulatory Visit: Payer: Self-pay

## 2020-02-08 ENCOUNTER — Encounter: Payer: Self-pay | Admitting: Thoracic Surgery (Cardiothoracic Vascular Surgery)

## 2020-02-08 ENCOUNTER — Ambulatory Visit
Admission: RE | Admit: 2020-02-08 | Discharge: 2020-02-08 | Disposition: A | Discharge status: Home / Self Care | Payer: Medicare PPO | Source: Ambulatory Visit | Attending: Thoracic Surgery (Cardiothoracic Vascular Surgery) | Admitting: Thoracic Surgery (Cardiothoracic Vascular Surgery)

## 2020-02-08 VITALS — BP 147/62 | HR 67 | Temp 97.9°F | Resp 20 | Ht 63.0 in | Wt 130.0 lb

## 2020-02-08 DIAGNOSIS — I712 Thoracic aortic aneurysm, without rupture, unspecified: Secondary | ICD-10-CM

## 2020-02-08 MED ORDER — IOPAMIDOL (ISOVUE-370) INJECTION 76%
75.0000 mL | Freq: Once | INTRAVENOUS | Status: AC | PRN
  Administered 2020-02-08: 75 mL via INTRAVENOUS

## 2020-02-08 NOTE — Progress Notes (Signed)
[address]       [place] [address]             [phone]     CARDIOTHORACIC SURGERY OFFICE NOTE  Primary Cardiologist is [name], MD PCP is [name], MD   HPI:  Patient is a 86 year old male who returns the office today for follow-up and surveillance of a sending thoracic aortic aneurysm.  He was originally seen in consultation in 2008 when he presented with an acute type B aortic dissection.  The false lumen of the descending thoracic aorta eventually thrombosed and healed completely.  We have been following him since then with serial CT angiography because of moderate aneurysmal enlargement of the ascending thoracic aorta and transverse aortic arch.  He was last seen here in our office on January 26, 2019.  Over the past year the patient has continued to do remarkably well.  He stopped swimming because of the COVID-19 pandemic and fears of going to the [facility].  In lieu of this he walks nearly 3 miles every day.  He remains remarkably active physically.  He has had some intermittent flareups of lower back pain, particularly when he is out digging in his garden.  He denies any chest pain or chest tightness.  He reports that his blood pressure remains well controlled.  He has not developed any other new medical problems.   Current Outpatient Medications  Medication Sig Dispense Refill  . ascorbic acid (VITAMIN C) 1000 MG tablet Take 2,000 mg by mouth 3 (three) times daily.     Marland Kitchen aspirin 81 MG tablet Take 81 mg by mouth at bedtime.     Marland Kitchen atorvastatin (LIPITOR) 20 MG tablet Take 1 tablet by mouth everyday at bedtime. Please call and schedule 6 month follow up appt. [phone] 90 tablet 0  . Cholecalciferol (VITAMIN D-3 PO) Take 6,000 mg by mouth daily.     . Choline Fenofibrate (FENOFIBRIC ACID) 135 MG CPDR TAKE 1 CAPSULE BY MOUTH EVERY DAY 90 capsule 1  . COLLAGEN PO Take 6,000 mg by mouth daily.    Marland Kitchen esomeprazole (NEXIUM) 40 MG capsule Take 40 mg by mouth  daily before breakfast.      . finasteride (PROSCAR) 5 MG tablet Take 5 mg by mouth daily.    . fish oil-omega-3 fatty acids 1000 MG capsule Take 1,000 mg by mouth daily.     Marland Kitchen labetalol (NORMODYNE) 100 MG tablet Take 1 tablet (100 mg total) by mouth 2 (two) times daily. 180 tablet 1  . latanoprost (XALATAN) 0.005 % ophthalmic solution Place 1 drop into both eyes at bedtime.     . Multiple Vitamin (MULITIVITAMIN WITH MINERALS) TABS Take 1 tablet by mouth daily.    . polyvinyl alcohol (LIQUIFILM TEARS) 1.4 % ophthalmic solution Place 1 drop into both eyes every 8 (eight) hours as needed for dry eyes.    Marland Kitchen QUERCETIN PO Take 1 tablet by mouth daily.    Marland Kitchen VITAMIN E PO Take 400 Units by mouth daily.      No current facility-administered medications for this visit.      Physical Exam:   BP (!) 147/62 (BP Location: Right Arm, Patient Position: Sitting, Cuff Size: Normal)   Pulse 67   Temp 97.9 F (36.6 C) (Temporal)   Resp 20   Ht 5\' 3"  (1.6 m)   Wt 130 lb (59 kg)   SpO2 95% Comment: RA  BMI 23.03 kg/m   General:  Well-appearing  Chest:   Clear to auscultation  CV:   Regular rate and rhythm without murmur  Incisions:  n/a  Abdomen:  Soft nontender  Extremities:  Warm and well-perfused  Diagnostic Tests:  CT ANGIOGRAPHY CHEST WITH CONTRAST  TECHNIQUE: Multidetector CT imaging of the chest was performed using the standard protocol during bolus administration of intravenous contrast. Multiplanar CT image reconstructions and MIPs were obtained to evaluate the vascular anatomy.  CONTRAST:  72mL ISOVUE-370 IOPAMIDOL (ISOVUE-370) INJECTION 76%  COMPARISON:  01/26/2019 and previous  FINDINGS: Cardiovascular: Borderline cardiomegaly with right ventricular and left atrial enlargement. No pericardial effusion. Satisfactory opacification of pulmonary arteries noted, and there is no evidence of pulmonary emboli. 3-vessel coronary calcifications. Good contrast opacification of the  thoracic aorta.  Aortic Root:  --Valve: 2.6 cm  --Sinuses: 4.7 cm  --Sinotubular Junction: 4 cm  Limitations by motion: Moderate  Thoracic Aorta:  --Ascending Aorta: 4.5 cm (stable by my measurement; the 4.9 cm measurement previously was not tangential to the center line of the lumen)  --Aortic Arch: 4.2 cm at the level of saccular aneurysm  --Descending Aorta: 3.6 cm  Classic 3 vessel brachiocephalic arterial origin anatomy without proximal stenosis or dissection. There is no dissection flap seen currently in the aorta. Moderate partially calcified atheromatous plaque in the distal arch, and through the descending segment.  Scattered calcified plaque in the visualized proximal abdominal aorta.  Mediastinum/Nodes: No hilar or mediastinal adenopathy.  Lungs/Pleura: No pleural effusion. No pneumothorax. Dependent atelectasis posteriorly in both lower lobes.  Upper Abdomen: Stable 1.8 cm probable cyst from the upper pole left kidney. No acute findings.  Musculoskeletal: Anterior vertebral endplate spurring at multiple levels in the mid and lower thoracic spine.  Review of the MIP images confirms the above findings.  IMPRESSION: 1. Stable 4.5 cm ascending and 4.2 cm arch thoracic aortic aneurysm. 2. No evidence of dissection. 3.  Aortic Atherosclerosis (ICD10-I70.0).   Electronically Signed   By: Lucrezia Europe M.D.   On: 02/08/2020 16:10   Impression:  I have personally reviewed the patient's follow-up CT angiogram performed earlier today.  He has stable moderate aneurysmal enlargement of the ascending thoracic aorta and transverse aortic arch.  There has been no interval change in size of his thoracic aorta over the last several years.  Clinically the patient is doing remarkably well.  Plan:  We have not recommended any change the patient's current medications.  I have discussed whether or not to continue with follow-up surveillance, and the  patient prefers to continue to get CT imaging every 2 years.    He understands that we would not consider him a candidate for any type of surgical intervention under any circumstances.  All of his questions have been addressed.  We will plan follow-up CT angiogram in 2 years with office visit following his scan.  He does not wish to come back on an annual basis unless symptoms develop, but he does wish to continue to follow his aneurysms periodically.    I spent in excess of 15 minutes during the conduct of this office consultation and >50% of this time involved direct face-to-face encounter with the patient for counseling and/or coordination of their care.    Valentina Gu. Roxy Manns, MD 02/08/2020 3:34 PM

## 2020-02-08 NOTE — Patient Instructions (Signed)
Continue all previous medications without any changes at this time  

## 2020-03-15 DIAGNOSIS — C61 Malignant neoplasm of prostate: Secondary | ICD-10-CM | POA: Diagnosis not present

## 2020-03-23 ENCOUNTER — Other Ambulatory Visit: Payer: Self-pay | Admitting: Cardiovascular Disease

## 2020-05-03 DIAGNOSIS — H401132 Primary open-angle glaucoma, bilateral, moderate stage: Secondary | ICD-10-CM | POA: Diagnosis not present

## 2020-05-15 ENCOUNTER — Encounter: Payer: Self-pay | Admitting: Cardiovascular Disease

## 2020-05-15 NOTE — Progress Notes (Signed)
Cardiology Office Note   Date:  05/16/2020   ID:  Dwayne Morris, DOB February 09, 1927, MRN [id number]  PCP:  [name], MD  Cardiologist:   [name], MD   Chief Complaint  Patient presents with  . Thoracic Aortic Dissection  . Hypertension  . Hyperlipidemia   1. Type III aortic dissection 2. Hypertension 3. Prostate cancer 4. 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  gentleman originally from [place]. He is done quite well since I last saw him 6 months ago. [name] not had any problems with his blood pressure. [name] been taking his medications the records his blood pressure on a regular basis.  He has a history of an aortic dissection. [name] not had any further episodes of chest pain. We have followed him with CT scans and his most recent CT scans have not shown any worsening of his dissection.  February 24, 2013:  Dwayne Morris is doing well. Still swiimming regularly. No CP , no dyspnea. His CT angio of the aorta which was unremarkable. He has been found to have a left inguinal hernia.   Jan. 12, 2015:  Dwayne Morris is doing well. He has been to [place] since last saw him. Still swimming reguarly.   April 14, 2014:  Doing well. No CP or issues. Still swimming regulalry.  He has noticed that on occasion is lower than 100 - he typically holds his labetalol .  The following morning the blood pressures usually normal.   Feb. 18, 2016:   Dwayne Morris is a 86 y.o. male who presents for follow up of his aortic dissection.  His home BP readings are all good.  He swims 1/2 mile a day 3 days a week.  Walks quite a bit.  Also is active in his gardent  Has some occasional back pain if he works in his garden  Sept. 8, 2016:  Doing well.  Has some "stuffiness in his chest "  ,  Seems to be more pronounced if he is stress.  Goes away when he walks around the block   We discussed his vertebral artery dissection that occurred 20 years ago .  was hospitalized  . Still has some left sided weakness adn "stiffness"   His son , Dwayne Morris, noticed that he occasionally has some facial drooping . He has discussed with Dr. Maudie Mercury.   Dr. Maudie Mercury suggest   Brought his BP log.  All his readings are normal  Oct. 25, 2016:  Dwayne Morris is seen today for some chest discomfort. Chest pressure , minor Feels better after swimming or walking . Not worsened with deep breath ( in fact, makes it feel better ) Not worsened with arm movement  calastinics seem to help   Mostly on the left side.  Not associated with sweats or dyspnea.  Is scheduled to have colonoscopy on Nov. 4 ( Dr. Benson Norway)  [facility] to hold his ASA for a week prior to colonoscopy . Has had polys removed in the past - complicated by a lower GI bleed. Apparently a clip had come loose.   December 20, 2015:  Dwayne Morris is doing well .  BP is stable.   Brought his BP log.  Swimming regularly   Oct. 23, 2017: Feeling well Still swimming 3 times a week - 1/2 mile .  Also walks 3 miles 3 times a week.  Brought his BP log .  Dec 26, 2016: Dwayne Morris is  doing well Swims regularly .   Walks 3 miles a day also  No CP or dyspnea.  BP records were reviewed .  Was seen by Dr. Roxy Manns .  CT at that time showed that the dissection was completely healed.   Jan 15, 2018:   Doing well Shared recent news about his son Dwayne Morris. BP is great. Brought his BP log  Shared a bottle of  Merlot with me   Nov. 18, 2019:    Doing great We disscussed his son's recent achievements  Feels well  No further CP or abd pain  Eating and drinking regularly  Still swimming .   November 17, 2019:  Dwayne Morris is doing well.  Is no longer swimming,   Walks  3 miles a day . Is seeing Dr. Roxy Manns every 2 years .  Brought his BP log  Has had both covid vaccines.   Sept. 20, 2021 Dwayne Morris is doing very well.  Walks 3 miles a day , has had some left lateral leg pain .    Brought his i BP log . Bp readings look good He needs a new primary MD ( Dr. Maudie Mercury is out on medical leave )       Past Medical History:  Diagnosis Date  . Aortic dissection (HCC)    type B aortic dissection in November 2008  . Hypertension   . Prostate cancer ([place])    PSA levels have decreased with garlic therapy  . Stroke [facility]) 1996   History of right vertebral artery dissection with stroke  . Thoracic aortic aneurysm [facility])     Past Surgical History:  Procedure Laterality Date  . arotic discection      2012   . COLONOSCOPY  09/14/2011   Procedure: COLONOSCOPY;  Surgeon: Beryle Beams, MD;  Location: [facility];  Service: Endoscopy;  Laterality: N/A;  . COLONOSCOPY WITH PROPOFOL N/A 07/01/2015   Procedure: COLONOSCOPY WITH PROPOFOL;  Surgeon: Carol Ada, MD;  [facility];  Service: Endoscopy;  Laterality: N/A;  . EYE SURGERY     catract     Current Outpatient Medications  Medication Sig Dispense Refill  . ascorbic acid (VITAMIN C) 1000 MG tablet Take 2,000 mg by mouth 3 (three) times daily.     Marland Kitchen aspirin 81 MG tablet Take 81 mg by mouth at bedtime.     Marland Kitchen atorvastatin (LIPITOR) 20 MG tablet TAKE 1 TABLET BY MOUTH AT BEDTIME. 90 tablet 2  . Cholecalciferol (VITAMIN D-3 PO) Take 6,000 mg by mouth daily.     . Choline Fenofibrate (FENOFIBRIC ACID) 135 MG CPDR TAKE 1 CAPSULE BY MOUTH EVERY DAY 90 capsule 1  . COLLAGEN PO Take 6,000 mg by mouth daily.    Marland Kitchen esomeprazole (NEXIUM) 40 MG capsule Take 40 mg by mouth daily before breakfast.      . finasteride (PROSCAR) 5 MG tablet Take 5 mg by mouth daily.    . fish oil-omega-3 fatty acids 1000 MG capsule Take 1,000 mg by mouth daily.     Marland Kitchen labetalol (NORMODYNE) 100 MG tablet Take 1 tablet (100 mg total) by mouth 2 (two) times daily. 180 tablet 1  . latanoprost (XALATAN) 0.005 % ophthalmic solution Place 1 drop into both eyes at bedtime.     . Multiple Vitamin (MULITIVITAMIN WITH MINERALS) TABS Take 1 tablet by mouth daily.    . polyvinyl alcohol (LIQUIFILM TEARS) 1.4 % ophthalmic solution Place 1 drop into both eyes every 8  (eight) hours as  needed for dry eyes.    Marland Kitchen QUERCETIN PO Take 1 tablet by mouth daily.    Marland Kitchen VITAMIN E PO Take 400 Units by mouth daily.      No current facility-administered medications for this visit.    Allergies:   Patient has no known allergies.    Social History:  The patient  reports that he quit smoking about 46 years ago. His smoking use included cigarettes. He has a 30.00 pack-year smoking history. He has never used smokeless tobacco. He reports current alcohol use of about 4.0 standard drinks of alcohol per week. He reports that he does not use drugs.   Family History:  The patient's family history is not on file.    ROS:   Noted in current hx    Physical Exam: Blood pressure (!) 112/54, pulse 68, height 5\' 3"  (1.6 m), weight 134 lb (60.8 kg), SpO2 96 %.  GEN:  Elderly male,  NAD  HEENT: Normal NECK: No JVD; No carotid bruits LYMPHATICS: No lymphadenopathy CARDIAC: RRR , no murmurs, rubs, gallops RESPIRATORY:  Clear to auscultation without rales, wheezing or rhonchi  ABDOMEN: Soft, non-tender, non-distended MUSCULOSKELETAL:  No edema; No deformity  SKIN: Warm and dry NEUROLOGIC:  Alert and oriented x 3   EKG:          Recent Labs: No results found for requested labs within last 8760 hours.    Lipid Panel    Component Value Date/Time   CHOL 126 12/24/2016 1621   TRIG 47 12/24/2016 1621   HDL 61 12/24/2016 1621   CHOLHDL 2.1 12/24/2016 1621   CHOLHDL 1.9 11/24/2015 1541   VLDL 11 11/24/2015 1541   LDLCALC 56 12/24/2016 1621      Wt Readings from Last 3 Encounters:  05/16/20 134 lb (60.8 kg)  02/08/20 130 lb (59 kg)  11/17/19 134 lb 8 oz (61 kg)      Other studies Reviewed: Additional studies/ records that were reviewed today include: . Review of the above records demonstrates:   ECG :      ASSESSMENT AND PLAN:  1. Type III aortic dissection -he has a type III aortic dissection.   Stable  Cont meds to ensure that his BP is well controlled.     2. Hypertension  -he keeps meticulous records on his blood pressure.  We will continue current medications.  3. Prostate cancer -managed by Dr. Jeffie Pollock.  His primary medical doctor, Dr. Maudie Mercury,  has been out on medical leave for quite some time.  We will try to arrange for another medical doctor to see him temporarily or perhaps assume his care.  Current medicines are reviewed at length with the patient today.  The patient does not have concerns regarding medicines.  The following changes have been made:  no change   Disposition:   FU with me in 6 months .     Signed, [name], MD  05/16/2020 5:53 PM    [facility], [place], [place]  [address] Phone: [phone]; Fax: [phone]

## 2020-05-16 ENCOUNTER — Other Ambulatory Visit: Payer: Self-pay

## 2020-05-16 ENCOUNTER — Ambulatory Visit (INDEPENDENT_AMBULATORY_CARE_PROVIDER_SITE_OTHER): Payer: Medicare PPO | Admitting: Cardiovascular Disease

## 2020-05-16 ENCOUNTER — Encounter: Payer: Self-pay | Admitting: Cardiovascular Disease

## 2020-05-16 VITALS — BP 112/54 | HR 68 | Ht 63.0 in | Wt 134.0 lb

## 2020-05-16 DIAGNOSIS — I7101 Dissection of thoracic aorta: Secondary | ICD-10-CM

## 2020-05-16 DIAGNOSIS — I71019 Dissection of thoracic aorta, unspecified: Secondary | ICD-10-CM

## 2020-05-16 NOTE — Patient Instructions (Addendum)
Medication Instructions:  Your provider recommends that you continue on your current medications as directed. Please refer to the Current Medication list given to you today.   *If you need a refill on your cardiac medications before your next appointment, please call your pharmacy*  Follow-Up: Your provider recommends that you schedule a follow-up appointment in 6 months with Dr. Acie Fredrickson.  [facility]  [phone]

## 2020-06-18 ENCOUNTER — Other Ambulatory Visit: Payer: Self-pay | Admitting: Cardiovascular Disease

## 2020-07-04 ENCOUNTER — Other Ambulatory Visit: Payer: Self-pay | Admitting: Cardiovascular Disease

## 2020-10-31 DIAGNOSIS — E7841 Elevated Lipoprotein(a): Secondary | ICD-10-CM | POA: Diagnosis not present

## 2020-10-31 DIAGNOSIS — R82998 Other abnormal findings in urine: Secondary | ICD-10-CM | POA: Diagnosis not present

## 2020-10-31 DIAGNOSIS — C61 Malignant neoplasm of prostate: Secondary | ICD-10-CM | POA: Diagnosis not present

## 2020-10-31 DIAGNOSIS — K219 Gastro-esophageal reflux disease without esophagitis: Secondary | ICD-10-CM | POA: Diagnosis not present

## 2020-10-31 DIAGNOSIS — I712 Thoracic aortic aneurysm, without rupture: Secondary | ICD-10-CM | POA: Diagnosis not present

## 2020-10-31 DIAGNOSIS — I10 Essential (primary) hypertension: Secondary | ICD-10-CM | POA: Diagnosis not present

## 2020-10-31 DIAGNOSIS — Z0000 Encounter for general adult medical examination without abnormal findings: Secondary | ICD-10-CM | POA: Diagnosis not present

## 2020-10-31 DIAGNOSIS — Z86010 Personal history of colonic polyps: Secondary | ICD-10-CM | POA: Diagnosis not present

## 2020-11-13 ENCOUNTER — Encounter: Payer: Self-pay | Admitting: Cardiovascular Disease

## 2020-11-13 NOTE — Progress Notes (Signed)
Cardiology Office Note   Date:  11/14/2020   ID:  Dwayne Morris, DOB 1927/02/20, MRN [id number]  PCP:  Haywood Pao, MD  Cardiologist:   [name], MD   Chief Complaint  Patient presents with  . Hypertension        1. Type III aortic dissection 2. Hypertension 3. Prostate cancer 4. 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  gentleman originally from [place]. He is done quite well since I last saw him 6 months ago. [name] not had any problems with his blood pressure. [name] been taking his medications the records his blood pressure on a regular basis.  He has a history of an aortic dissection. [name] not had any further episodes of chest pain. We have followed him with CT scans and his most recent CT scans have not shown any worsening of his dissection.  February 24, 2013:  DK is doing well. Still swiimming regularly. No CP , no dyspnea. His CT angio of the aorta which was unremarkable. He has been found to have a left inguinal hernia.   Jan. 12, 2015:  DK is doing well. He has been to [place] since last saw him. Still swimming reguarly.   April 14, 2014:  Doing well. No CP or issues. Still swimming regulalry.  He has noticed that on occasion is lower than 100 - he typically holds his labetalol .  The following morning the blood pressures usually normal.   Feb. 18, 2016:   Dwayne Morris is a 87 y.o. male who presents for follow up of his aortic dissection.  His home BP readings are all good.  He swims 1/2 mile a day 3 days a week.  Walks quite a bit.  Also is active in his gardent  Has some occasional back pain if he works in his garden  Sept. 8, 2016:  Doing well.  Has some "stuffiness in his chest "  ,  Seems to be more pronounced if he is stress.  Goes away when he walks around the block   We discussed his vertebral artery dissection that occurred 20 years ago .  was hospitalized . Still has some left sided  weakness adn "stiffness"   His son , Dwayne Morris, noticed that he occasionally has some facial drooping . He has discussed with Dr. Maudie Mercury.   Dr. Maudie Mercury suggest   Brought his BP log.  All his readings are normal  Oct. 25, 2016:  DK is seen today for some chest discomfort. Chest pressure , minor Feels better after swimming or walking . Not worsened with deep breath ( in fact, makes it feel better ) Not worsened with arm movement  calastinics seem to help   Mostly on the left side.  Not associated with sweats or dyspnea.  Is scheduled to have colonoscopy on Nov. 4 ( Dr. Benson Norway)  [facility] to hold his ASA for a week prior to colonoscopy . Has had polys removed in the past - complicated by a lower GI bleed. Apparently a clip had come loose.   December 20, 2015:  DK is doing well .  BP is stable.   Brought his BP log.  Swimming regularly   Oct. 23, 2017: Feeling well Still swimming 3 times a week - 1/2 mile .  Also walks 3 miles 3 times a week.  Brought his BP log .  Dec 26, 2016: DK is doing well  Swims regularly .   Walks 3 miles a day also  No CP or dyspnea.  BP records were reviewed .  Was seen by Dr. Roxy Manns .  CT at that time showed that the dissection was completely healed.   Jan 15, 2018:   Doing well Shared recent news about his son Dwayne Morris. BP is great. Brought his BP log  Shared a bottle of  Merlot with me   Nov. 18, 2019:    Doing great We disscussed his son's recent achievements  Feels well  No further CP or abd pain  Eating and drinking regularly  Still swimming .   November 17, 2019:  DK is doing well.  Is no longer swimming,   Walks  3 miles a day . Is seeing Dr. Roxy Manns every 2 years .  Brought his BP log  Has had both covid vaccines.   Sept. 20, 2021 DK is doing very well.  Walks 3 miles a day , has had some left lateral leg pain .    Brought his i BP log . Bp readings look good He needs a new primary MD ( Dr. Maudie Mercury is out on medical leave )   November 14, 2020: DK is doing  well.  He remains very active, Has been walking instead of swimming ( covid restrictions ) Pulled a muscle on 1 occasion,  Has healed.   His las CTA of the chest / abd.   His asc. Aorta measures 4.5 cm   Past Medical History:  Diagnosis Date  . Aortic dissection (HCC)    type B aortic dissection in November 2008  . Hypertension   . Prostate cancer ([place])    PSA levels have decreased with garlic therapy  . Stroke [facility]) 1996   History of right vertebral artery dissection with stroke  . Thoracic aortic aneurysm [facility])     Past Surgical History:  Procedure Laterality Date  . arotic discection      2012   . COLONOSCOPY  09/14/2011   Procedure: COLONOSCOPY;  Surgeon: Dwayne Beams, MD;  Location: [place];  Service: Endoscopy;  Laterality: N/A;  . COLONOSCOPY WITH PROPOFOL N/A 07/01/2015   Procedure: COLONOSCOPY WITH PROPOFOL;  Surgeon: Dwayne Ada, MD;  [facility];  Service: Endoscopy;  Laterality: N/A;  . EYE SURGERY     catract     Current Outpatient Medications  Medication Sig Dispense Refill  . ascorbic acid (VITAMIN C) 1000 MG tablet Take 2,000 mg by mouth 3 (three) times daily.     Marland Kitchen aspirin 81 MG tablet Take 81 mg by mouth at bedtime.    Marland Kitchen atorvastatin (LIPITOR) 20 MG tablet TAKE 1 TABLET BY MOUTH AT BEDTIME. 90 tablet 2  . Cholecalciferol (VITAMIN D-3 PO) Take 6,000 mg by mouth daily.     . Choline Fenofibrate (FENOFIBRIC ACID) 135 MG CPDR TAKE 1 CAPSULE BY MOUTH EVERY DAY 90 capsule 3  . COLLAGEN PO Take 6,000 mg by mouth daily.    Marland Kitchen esomeprazole (NEXIUM) 40 MG capsule Take 40 mg by mouth daily before breakfast.    . finasteride (PROSCAR) 5 MG tablet Take 5 mg by mouth daily.    . fish oil-omega-3 fatty acids 1000 MG capsule Take 1,000 mg by mouth daily.     Marland Kitchen labetalol (NORMODYNE) 100 MG tablet TAKE 1 TABLET BY MOUTH TWICE A DAY 180 tablet 1  . latanoprost (XALATAN) 0.005 % ophthalmic solution Place 1 drop into both eyes at bedtime.     Marland Kitchen  Multiple  Vitamin (MULITIVITAMIN WITH MINERALS) TABS Take 1 tablet by mouth daily.    . polyvinyl alcohol (LIQUIFILM TEARS) 1.4 % ophthalmic solution Place 1 drop into both eyes every 8 (eight) hours as needed for dry eyes.    Marland Kitchen QUERCETIN PO Take 1 tablet by mouth daily.    Marland Kitchen VITAMIN E PO Take 400 Units by mouth daily.     No current facility-administered medications for this visit.    Allergies:   Patient has no known allergies.    Social History:  The patient  reports that he quit smoking about 46 years ago. His smoking use included cigarettes. He has a 30.00 pack-year smoking history. He has never used smokeless tobacco. He reports current alcohol use of about 4.0 standard drinks of alcohol per week. He reports that he does not use drugs.   Family History:  The patient's family history is not on file.    ROS:   Noted in current hx    Physical Exam: Blood pressure (!) 104/54, pulse 61, height 5\' 3"  (1.6 m), weight 132 lb 3.2 oz (60 kg), SpO2 94 %.  GEN:  Well nourished, well developed in no acute distress HEENT: Normal NECK: No JVD; No carotid bruits LYMPHATICS: No lymphadenopathy CARDIAC: RRR , no murmurs, rubs, gallops RESPIRATORY:  Clear to auscultation without rales, wheezing or rhonchi  ABDOMEN: Soft, non-tender, non-distended MUSCULOSKELETAL:  No edema; No deformity  SKIN: Warm and dry NEUROLOGIC:  Alert and oriented x 3   EKG:         March 21, 202; NSR at 63.  No ST or T wave changes.    Recent Labs: No results found for requested labs within last 8760 hours.    Lipid Panel    Component Value Date/Time   CHOL 126 12/24/2016 1621   TRIG 47 12/24/2016 1621   HDL 61 12/24/2016 1621   CHOLHDL 2.1 12/24/2016 1621   CHOLHDL 1.9 11/24/2015 1541   VLDL 11 11/24/2015 1541   LDLCALC 56 12/24/2016 1621      Wt Readings from Last 3 Encounters:  11/14/20 132 lb 3.2 oz (60 kg)  05/16/20 134 lb (60.8 kg)  02/08/20 130 lb (59 kg)      Other studies Reviewed: Additional  studies/ records that were reviewed today include: . Review of the above records demonstrates:   ECG :      ASSESSMENT AND PLAN:  1. Type III aortic dissection -  Seems to be stable .   Cont current meds.   He is due to have another CTA next year   2. Hypertension  -  BP is well controlled  3. Prostate cancer -  Follows up with Dr. Jeffie Pollock    Current medicines are reviewed at length with the patient today.  The patient does not have concerns regarding medicines.  The following changes have been made:  no change   Disposition:   FU with me in 6 months .     Signed, [name], MD  11/14/2020 4:10 PM    [facility] [place], [place],   [address] Phone: [phone]; Fax: [phone]

## 2020-11-14 ENCOUNTER — Encounter: Payer: Self-pay | Admitting: Cardiovascular Disease

## 2020-11-14 ENCOUNTER — Other Ambulatory Visit: Payer: Self-pay

## 2020-11-14 ENCOUNTER — Ambulatory Visit: Payer: Medicare PPO | Admitting: Cardiovascular Disease

## 2020-11-14 VITALS — BP 104/54 | HR 61 | Ht 63.0 in | Wt 132.2 lb

## 2020-11-14 DIAGNOSIS — I10 Essential (primary) hypertension: Secondary | ICD-10-CM | POA: Diagnosis not present

## 2020-11-14 DIAGNOSIS — I71019 Dissection of thoracic aorta, unspecified: Secondary | ICD-10-CM

## 2020-11-14 DIAGNOSIS — I7101 Dissection of thoracic aorta: Secondary | ICD-10-CM | POA: Diagnosis not present

## 2020-11-14 NOTE — Patient Instructions (Addendum)
For leg cramps  Try any of these:   1 spoon ful of yellow mustard Diet tonic water  Small can of V-8 juice      Medication Instructions:  Your physician recommends that you continue on your current medications as directed. Please refer to the Current Medication list given to you today.  *If you need a refill on your cardiac medications before your next appointment, please call your pharmacy*   Lab Work: none If you have labs (blood work) drawn today and your tests are completely normal, you will receive your results only by: Marland Kitchen MyChart Message (if you have MyChart) OR . A paper copy in the mail If you have any lab test that is abnormal or we need to change your treatment, we will call you to review the results.   Testing/Procedures: none   Follow-Up: At [facility], you and your health needs are our priority.  As part of our continuing mission to provide you with exceptional heart care, we have created designated Provider Care Teams.  These Care Teams include your primary Cardiologist (physician) and Advanced Practice Providers (APPs -  Physician Assistants and Nurse Practitioners) who all work together to provide you with the care you need, when you need it.  We recommend signing up for the patient portal called "MyChart".  Sign up information is provided on this After Visit Summary.  MyChart is used to connect with patients for Virtual Visits (Telemedicine).  Patients are able to view lab/test results, encounter notes, upcoming appointments, etc.  Non-urgent messages can be sent to your provider as well.   To learn more about what you can do with MyChart, go to [email or web address].    Your next appointment:   6 month(s)  The format for your next appointment:   In Person  Provider:   You may see [name], MD or one of the following Advanced Practice Providers on your designated Care Team:    Richardson Dopp, PA-C  [place], [place]

## 2020-12-15 ENCOUNTER — Other Ambulatory Visit: Payer: Self-pay | Admitting: Cardiovascular Disease

## 2020-12-24 ENCOUNTER — Other Ambulatory Visit: Payer: Self-pay | Admitting: Cardiovascular Disease

## 2021-01-13 IMAGING — CT CT ANGIO CHEST
4 of 8 series · 14 of 36 positions shown · IV contrast (iopamidol)
Comparison: 01/26/2019 and previous

CLINICAL DATA: Thoracic aortic aneurysm, follow-up. Previous
dissection and repair.

EXAM:
CT ANGIOGRAPHY CHEST WITH CONTRAST
TECHNIQUE: Multidetector CT imaging of the chest was performed using the
standard protocol during bolus administration of intravenous
contrast. Multiplanar CT image reconstructions and MIPs were
obtained to evaluate the vascular anatomy.
CONTRAST:  75mL OPATHG-Z42 IOPAMIDOL (OPATHG-Z42) INJECTION 76%

[Series 5: chest w/o 2.00 br40 s3 axial · axial · non-contrast · 0.50mm/px · z∈[+1414,+1622]mm · 5 of 157 slices shown]
[im 27/157  lung]
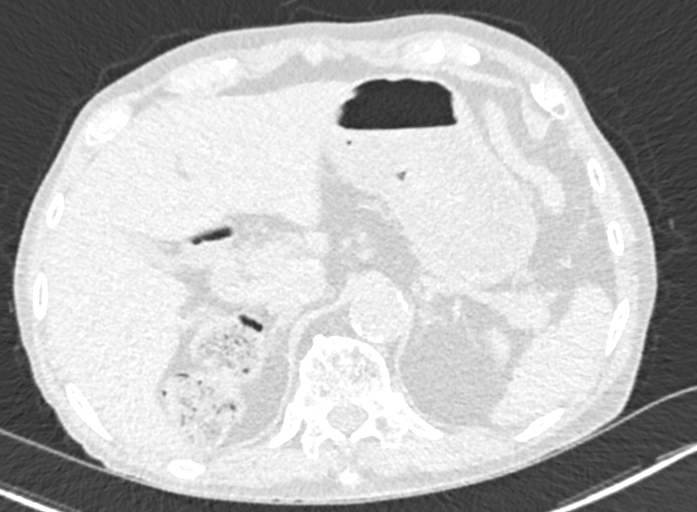
[im 53/157  mediastinal]
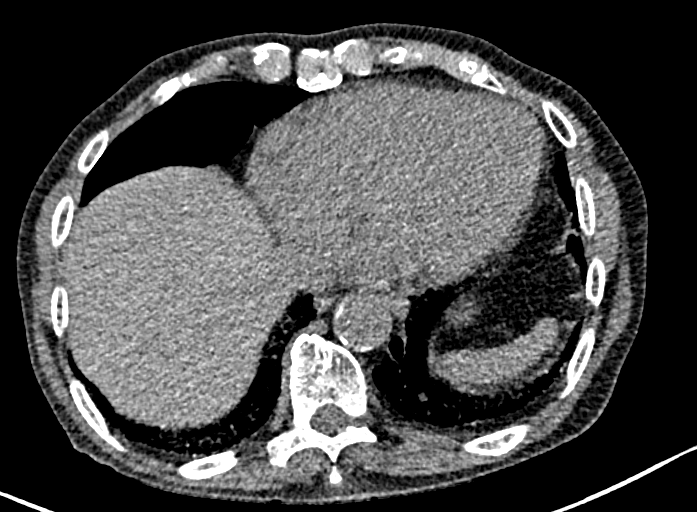
[im 79/157  lung]
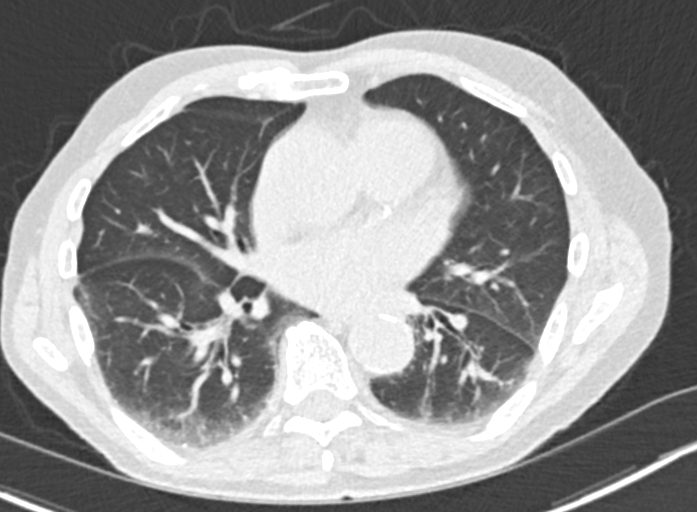
[im 105/157  mediastinal]
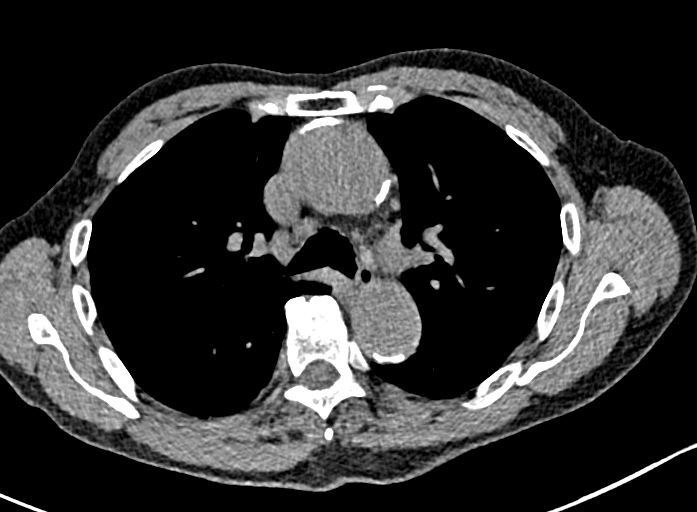
[im 131/157  lung]
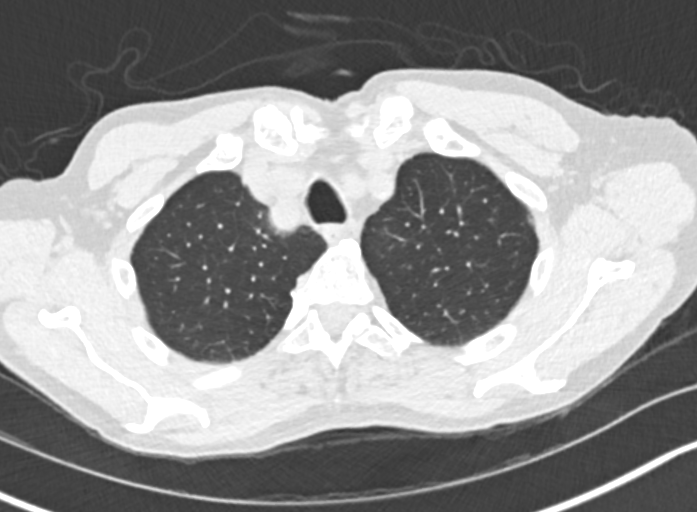

[Series 7: chest w/o 2.00 br60 s3 axial · axial · non-contrast · 0.50mm/px · z∈[+1414,+1622]mm · 5 of 157 slices shown]
[im 27/157  lung]
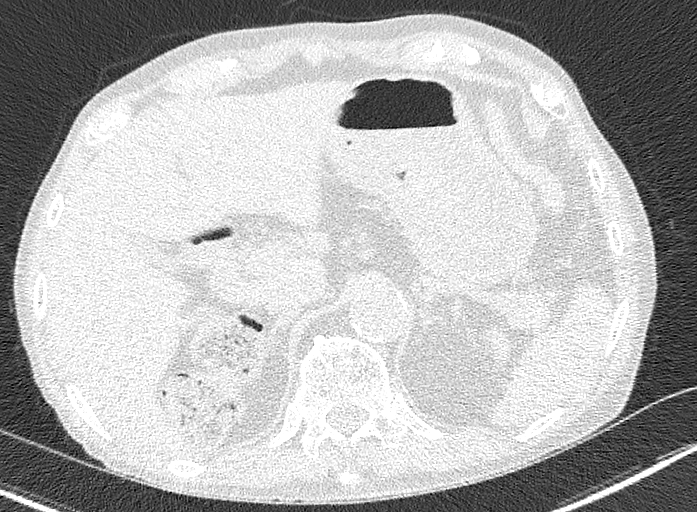
[im 53/157  lung]
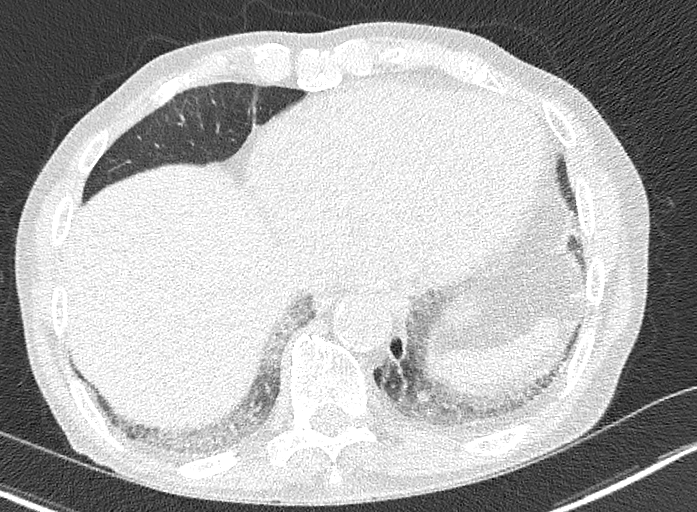
[im 79/157  lung]
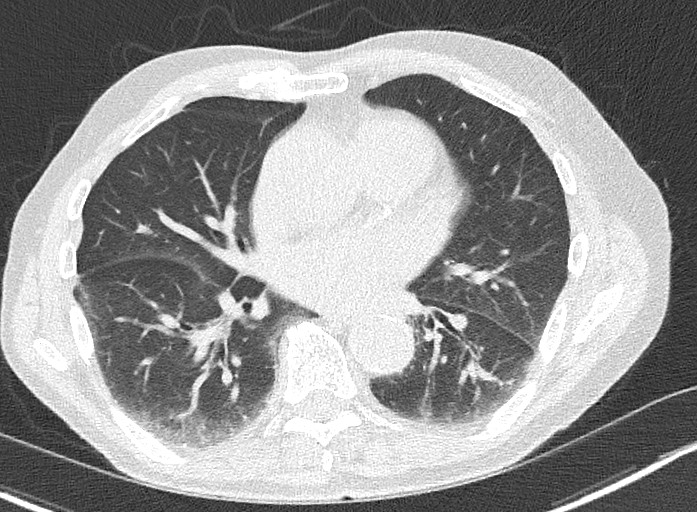
[im 105/157  lung]
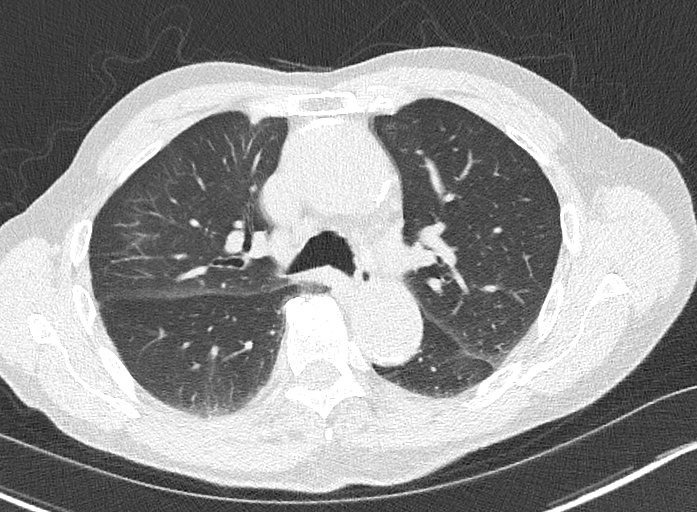
[im 131/157  lung]
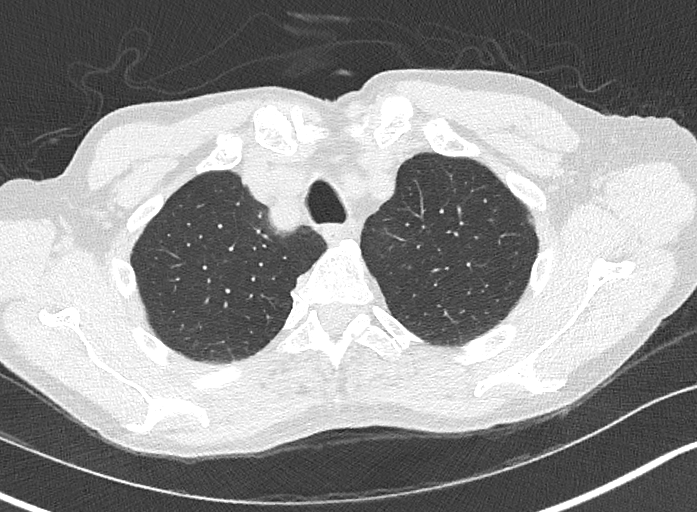

[Series 9: cta thorax 2.00 bv36 s3 axial arterial · axial · arterial · 0.51mm/px · z∈[+1421,+1527]mm · 3 of 159 slices shown]
[im 27/159  lung]
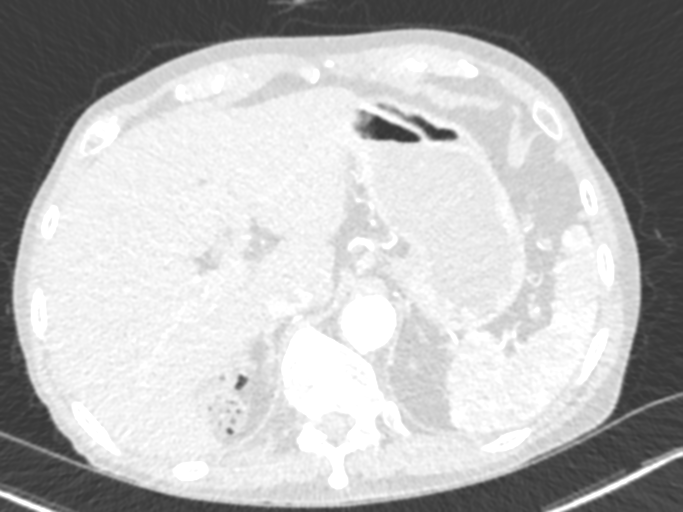
[im 53/159  lung]
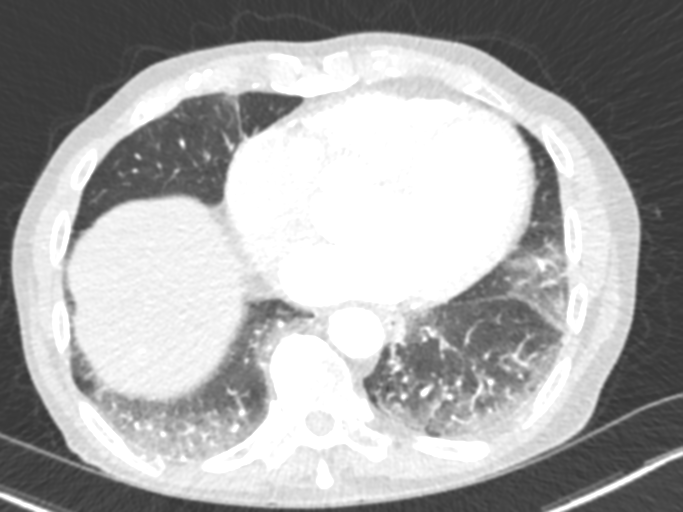
[im 80/159  lung]
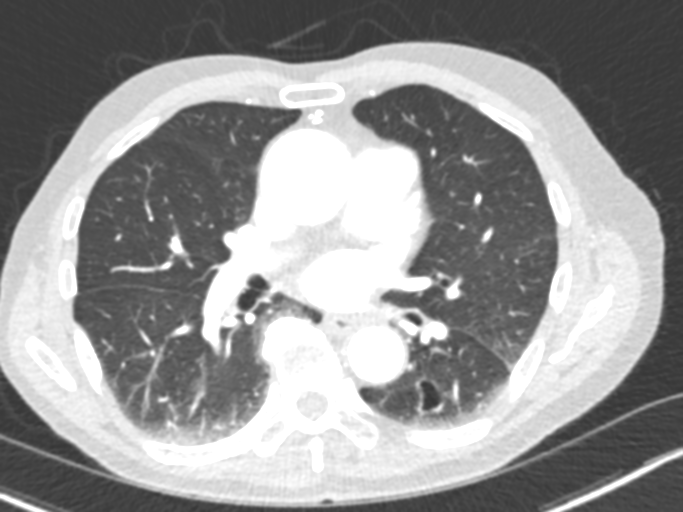

[Series 14: cta thorax 2.00 bv36 s3 cor st · coronal · 0.63mm/px · 1 of 129 slices shown]
[im 65/129  mediastinal]
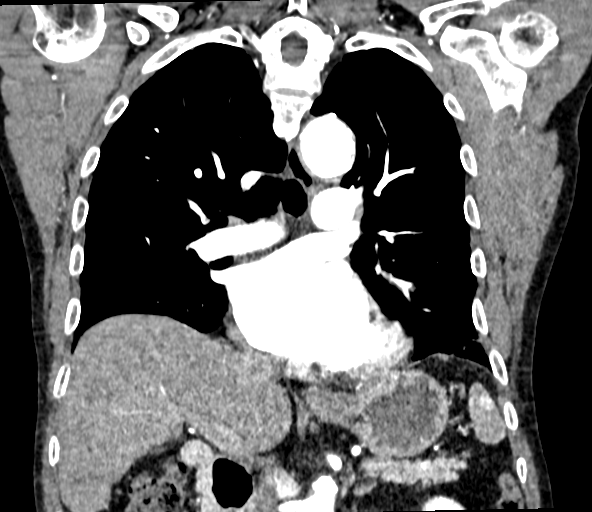

[14 of 36 positions shown; findings below may reference images not displayed]

FINDINGS: Cardiovascular: Borderline cardiomegaly with right ventricular and
left atrial enlargement. No pericardial effusion. Satisfactory
opacification of pulmonary arteries noted, and there is no evidence
of pulmonary emboli. 3-vessel coronary calcifications. Good contrast
opacification of the thoracic aorta.

Aortic Root:

--Valve: 2.6 cm

--Sinuses: 4.7 cm

--Sinotubular Junction: 4 cm

Limitations by motion: Moderate

Thoracic Aorta:

--Ascending Aorta: 4.5 cm (stable by my measurement; the 4.9 cm
measurement previously was not tangential to the center line of the
lumen)

--Aortic Arch: 4.2 cm at the level of saccular aneurysm

--Descending Aorta: 3.6 cm

Classic 3 vessel brachiocephalic arterial origin anatomy without
proximal stenosis or dissection. There is no dissection flap seen
currently in the aorta. Moderate partially calcified atheromatous
plaque in the distal arch, and through the descending segment.

Scattered calcified plaque in the visualized proximal abdominal
aorta.

Mediastinum/Nodes: No hilar or mediastinal adenopathy.

Lungs/Pleura: No pleural effusion. No pneumothorax. Dependent
atelectasis posteriorly in both lower lobes.

Upper Abdomen: Stable 1.8 cm probable cyst from the upper pole left
kidney. No acute findings.

Musculoskeletal: Anterior vertebral endplate spurring at multiple
levels in the mid and lower thoracic spine.

Review of the MIP images confirms the above findings.
IMPRESSION: 1. Stable 4.5 cm ascending and 4.2 cm arch thoracic aortic aneurysm.
2. No evidence of dissection.
3.  Aortic Atherosclerosis ([id number]).

## 2021-01-24 DIAGNOSIS — H52203 Unspecified astigmatism, bilateral: Secondary | ICD-10-CM | POA: Diagnosis not present

## 2021-01-24 DIAGNOSIS — H35373 Puckering of macula, bilateral: Secondary | ICD-10-CM | POA: Diagnosis not present

## 2021-01-24 DIAGNOSIS — H04123 Dry eye syndrome of bilateral lacrimal glands: Secondary | ICD-10-CM | POA: Diagnosis not present

## 2021-01-24 DIAGNOSIS — H401132 Primary open-angle glaucoma, bilateral, moderate stage: Secondary | ICD-10-CM | POA: Diagnosis not present

## 2021-05-19 DIAGNOSIS — N401 Enlarged prostate with lower urinary tract symptoms: Secondary | ICD-10-CM | POA: Diagnosis not present

## 2021-05-19 DIAGNOSIS — C61 Malignant neoplasm of prostate: Secondary | ICD-10-CM | POA: Diagnosis not present

## 2021-05-19 DIAGNOSIS — R351 Nocturia: Secondary | ICD-10-CM | POA: Diagnosis not present

## 2021-05-19 DIAGNOSIS — R972 Elevated prostate specific antigen [PSA]: Secondary | ICD-10-CM | POA: Diagnosis not present

## 2021-05-31 DIAGNOSIS — K645 Perianal venous thrombosis: Secondary | ICD-10-CM | POA: Diagnosis not present

## 2021-05-31 DIAGNOSIS — K219 Gastro-esophageal reflux disease without esophagitis: Secondary | ICD-10-CM | POA: Diagnosis not present

## 2021-05-31 DIAGNOSIS — Z86010 Personal history of colonic polyps: Secondary | ICD-10-CM | POA: Diagnosis not present

## 2021-06-01 ENCOUNTER — Other Ambulatory Visit: Payer: Self-pay | Admitting: Cardiovascular Disease

## 2021-06-28 ENCOUNTER — Other Ambulatory Visit: Payer: Self-pay | Admitting: Cardiovascular Disease

## 2021-06-28 DIAGNOSIS — Z20822 Contact with and (suspected) exposure to covid-19: Secondary | ICD-10-CM | POA: Diagnosis not present

## 2021-09-21 ENCOUNTER — Encounter: Payer: Self-pay | Admitting: Cardiovascular Disease

## 2021-09-21 NOTE — Progress Notes (Signed)
Cardiology Office Note   Date:  09/22/2021   ID:  DEDRICK HEFFNER, DOB Aug 12, 1927, MRN [id number]  PCP:  Haywood Pao, MD  Cardiologist:   [name], MD   Chief Complaint  Patient presents with   Hypertension   1. Type III aortic dissection 2. Hypertension 3. Prostate cancer 4. Hx of vertebral artery dissection ( aprox 20 years ago )     DK is an 88 y.o.  gentleman originally from [place]. He is done quite well since I last saw him 6 months ago. [name] not had any problems with his blood pressure. [name] been taking his medications the records his blood pressure on a regular basis.  He has a history of an aortic dissection. [name] not had any further episodes of chest pain. We have followed him with CT scans and his most recent CT scans have not shown any worsening of his dissection.  February 24, 2013:  DK is doing well.  Still swiimming regularly.  No CP , no dyspnea.   His CT angio of the aorta which was unremarkable.    He has been found to have a left inguinal hernia.   Jan. 12, 2015:  DK is doing well.  He has been to [place] since last saw him.  Still swimming reguarly.    April 14, 2014:  Doing well.  No CP or issues.  Still swimming regulalry.    He has noticed that on occasion is lower than 100 - he typically holds his labetalol .  The following morning the blood pressures usually normal.   Feb. 18, 2016:   HARALD QUEVEDO is a 88 y.o. male who presents for follow up of his aortic dissection.  His home BP readings are all good.  He swims 1/2 mile a day 3 days a week.  Walks quite a bit.  Also is active in his gardent  Has some occasional back pain if he works in his garden  Sept. 8, 2016:  Doing well.  Has some "stuffiness in his chest "  ,  Seems to be more pronounced if he is stress.  Goes away when he walks around the block   We discussed his vertebral artery dissection that occurred 20 years ago .  was hospitalized . Still has some left sided weakness adn  "stiffness"   His son , [name], noticed that he occasionally has some facial drooping . He has discussed with Dr. Maudie Mercury.   Dr. Maudie Mercury suggest   Brought his BP log.  All his readings are normal  Oct. 25, 2016:  DK is seen today for some chest discomfort. Chest pressure , minor Feels better after swimming or walking . Not worsened with deep breath ( in fact, makes it feel better ) Not worsened with arm movement  calastinics seem to help   Mostly on the left side.  Not associated with sweats or dyspnea.  Is scheduled to have colonoscopy on Nov. 4 ( Dr. Benson Norway)  [facility] to hold his ASA for a week prior to colonoscopy . Has had polys removed in the past - complicated by a lower GI bleed. Apparently a clip had come loose.   December 20, 2015:  DK is doing well .  BP is stable.   Brought his BP log.  Swimming regularly   Oct. 23, 2017: Feeling well Still swimming 3 times a week - 1/2 mile .  Also walks 3 miles 3 times a week.  Brought his BP  log .  Dec 26, 2016: DK is doing well Swims regularly .   Walks 3 miles a day also  No CP or dyspnea.  BP records were reviewed .  Was seen by Dr. Roxy Manns .  CT at that time showed that the dissection was completely healed.   Jan 15, 2018:   Doing well Shared recent news about his son [name]. BP is great. Brought his BP log  Shared a bottle of  Merlot with me   Nov. 18, 2019:    Doing great We disscussed his son's recent achievements  Feels well  No further CP or abd pain  Eating and drinking regularly  Still swimming .   November 17, 2019:  DK is doing well.  Is no longer swimming,   Walks  3 miles a day . Is seeing Dr. Roxy Manns every 2 years .  Brought his BP log  Has had both covid vaccines.   Sept. 20, 2021 DK is doing very well.  Walks 3 miles a day , has had some left lateral leg pain .    Brought his i BP log . Bp readings look good He needs a new primary MD ( Dr. Maudie Mercury is out on medical leave )   November 14, 2020: DK is doing well.  He  remains very active, Has been walking instead of swimming ( covid restrictions ) Pulled a muscle on 1 occasion,  Has healed.   His las CTA of the chest / abd.   His asc. Aorta measures 4.5 cm   Jan. 27, 2023 DK is seen today for follow up of his HTN, type III aortic dissection 1-2 months ago, he developed a dull mid-sternal chest pain .  He called for appt .  Now the pain is better .  The pain did not feel like his aortic dissection pain  ( Previous pain was sharp lasting mid scapular pain  Is not swimming due to [place]  Now he walks several miles a day 4-5 days a week .  Walks late at night  Also does some exercises in his home .    Past Medical History:  Diagnosis Date   Aortic dissection ([place])    type B aortic dissection in November 2008   Hypertension    Prostate cancer [facility])    PSA levels have decreased with garlic therapy   Stroke [facility]) 1996   History of right vertebral artery dissection with stroke   Thoracic aortic aneurysm     Past Surgical History:  Procedure Laterality Date   arotic discection      2012    COLONOSCOPY  09/14/2011   Procedure: COLONOSCOPY;  Surgeon: Beryle Beams, MD;  Location: [place];  Service: Endoscopy;  Laterality: N/A;   COLONOSCOPY WITH PROPOFOL N/A 07/01/2015   Procedure: COLONOSCOPY WITH PROPOFOL;  Surgeon: Carol Ada, MD;  [facility];  Service: Endoscopy;  Laterality: N/A;   EYE SURGERY     catract     Current Outpatient Medications  Medication Sig Dispense Refill   ascorbic acid (VITAMIN C) 1000 MG tablet Take 2,000 mg by mouth 3 (three) times daily.      aspirin 81 MG tablet Take 81 mg by mouth at bedtime.     atorvastatin (LIPITOR) 20 MG tablet TAKE 1 TABLET BY MOUTH EVERYDAY AT BEDTIME 90 tablet 3   Choline Fenofibrate (FENOFIBRIC ACID) 135 MG CPDR Take 1 tablet by mouth daily. PLEASE CONTACT OFFICE FOR AN APPOINTMENT 90 capsule  2   COLLAGEN PO Take 6,000 mg by mouth daily.     esomeprazole (NEXIUM) 40 MG  capsule Take 40 mg by mouth daily before breakfast.     finasteride (PROSCAR) 5 MG tablet Take 5 mg by mouth daily.     fish oil-omega-3 fatty acids 1000 MG capsule Take 1,000 mg by mouth daily.      labetalol (NORMODYNE) 100 MG tablet TAKE 1 TABLET BY MOUTH TWICE A DAY 180 tablet 1   latanoprost (XALATAN) 0.005 % ophthalmic solution Place 1 drop into both eyes at bedtime.      Multiple Vitamin (MULITIVITAMIN WITH MINERALS) TABS Take 1 tablet by mouth daily.     polyvinyl alcohol (LIQUIFILM TEARS) 1.4 % ophthalmic solution Place 1 drop into both eyes every 8 (eight) hours as needed for dry eyes.     VITAMIN E PO Take 400 Units by mouth daily.     Cholecalciferol (VITAMIN D-3 PO) Take 6,000 mg by mouth daily.  (Patient not taking: Reported on 09/22/2021)     QUERCETIN PO Take 1 tablet by mouth daily. (Patient not taking: Reported on 09/22/2021)     No current facility-administered medications for this visit.    Allergies:   Patient has no known allergies.    Social History:  The patient  reports that he quit smoking about 47 years ago. His smoking use included cigarettes. He has a 30.00 pack-year smoking history. He has never used smokeless tobacco. He reports current alcohol use of about 4.0 standard drinks per week. He reports that he does not use drugs.   Family History:  The patient's family history is not on file.    ROS:   Noted in current hx   Physical Exam: Blood pressure 124/70, pulse 79, height 5\' 3"  (1.6 m), weight 127 lb 12.8 oz (58 kg), SpO2 94 %.  GEN:  Well nourished, well developed in no acute distress HEENT: Normal NECK: No JVD; No carotid bruits LYMPHATICS: No lymphadenopathy CARDIAC: RRR , no murmurs, rubs, gallops RESPIRATORY:  Clear to auscultation without rales, wheezing or rhonchi  ABDOMEN: Soft, non-tender, non-distended MUSCULOSKELETAL:  No edema; No deformity  SKIN: Warm and dry NEUROLOGIC:  Alert and oriented x 3    EKG:            Recent Labs: No  results found for requested labs within last 8760 hours.    Lipid Panel    Component Value Date/Time   CHOL 126 12/24/2016 1621   TRIG 47 12/24/2016 1621   HDL 61 12/24/2016 1621   CHOLHDL 2.1 12/24/2016 1621   CHOLHDL 1.9 11/24/2015 1541   VLDL 11 11/24/2015 1541   LDLCALC 56 12/24/2016 1621      Wt Readings from Last 3 Encounters:  09/22/21 127 lb 12.8 oz (58 kg)  11/14/20 132 lb 3.2 oz (60 kg)  05/16/20 134 lb (60.8 kg)      Other studies Reviewed: Additional studies/ records that were reviewed today include: . Review of the above records demonstrates:   ECG :      ASSESSMENT AND PLAN:  1. Type III aortic dissection -   stable.   Will repeat his CT angio Will set him up to see TCTS - Dr. Roxy Manns has moved his practice   2. Hypertension  - well controlled.     3. Prostate cancer -  Follows up with Dr. Jeffie Pollock    Current medicines are reviewed at length with the patient today.  The patient does  not have concerns regarding medicines.  The following changes have been made:  no change   Disposition:       Signed, [name], MD  09/22/2021 4:15 PM    [facility] [place], [place], [place]  [address] Phone: [phone]; Fax: [phone]

## 2021-09-22 ENCOUNTER — Other Ambulatory Visit: Payer: Self-pay

## 2021-09-22 ENCOUNTER — Ambulatory Visit (INDEPENDENT_AMBULATORY_CARE_PROVIDER_SITE_OTHER): Payer: Medicare PPO | Admitting: Cardiovascular Disease

## 2021-09-22 ENCOUNTER — Encounter: Payer: Self-pay | Admitting: Cardiovascular Disease

## 2021-09-22 VITALS — BP 124/70 | HR 79 | Ht 63.0 in | Wt 127.8 lb

## 2021-09-22 DIAGNOSIS — I712 Thoracic aortic aneurysm, without rupture, unspecified: Secondary | ICD-10-CM | POA: Diagnosis not present

## 2021-09-22 DIAGNOSIS — I10 Essential (primary) hypertension: Secondary | ICD-10-CM | POA: Diagnosis not present

## 2021-09-22 DIAGNOSIS — I71019 Dissection of thoracic aorta, unspecified: Secondary | ICD-10-CM | POA: Diagnosis not present

## 2021-09-22 NOTE — Patient Instructions (Addendum)
Medication Instructions:  Your physician recommends that you continue on your current medications as directed. Please refer to the Current Medication list given to you today.  *If you need a refill on your cardiac medications before your next appointment, please call your pharmacy*   Lab Work: TODAY:  BMET  If you have labs (blood work) drawn today and your tests are completely normal, you will receive your results only by: [place] (if you have MyChart) OR A paper copy in the mail If you have any lab test that is abnormal or we need to change your treatment, we will call you to review the results.   Testing/Procedures: Your physician recommends you have a CTA-Chest for the Thoracic Aorta Aneurysm.  Non-Cardiac CT Angiography (CTA), is a special type of CT scan that uses a computer to produce multi-dimensional views of major blood vessels throughout the body. In CT angiography, a contrast material is injected through an IV to help visualize the blood vessels    Follow-Up: At [facility], you and your health needs are our priority.  As part of our continuing mission to provide you with exceptional heart care, we have created designated Provider Care Teams.  These Care Teams include your primary Cardiologist (physician) and Advanced Practice Providers (APPs -  Physician Assistants and Nurse Practitioners) who all work together to provide you with the care you need, when you need it.  We recommend signing up for the patient portal called "MyChart".  Sign up information is provided on this After Visit Summary.  MyChart is used to connect with patients for Virtual Visits (Telemedicine).  Patients are able to view lab/test results, encounter notes, upcoming appointments, etc.  Non-urgent messages can be sent to your provider as well.   To learn more about what you can do with MyChart, go to [email or web address].    Your next appointment:   6 month(s)  The format for your next  appointment:   In Person  Provider:   [name], MD     Other Instructions

## 2021-09-23 LAB — BASIC METABOLIC PANEL
BUN/Creatinine Ratio: 26 — ABNORMAL HIGH (ref 10–24)
BUN: 25 mg/dL (ref 10–36)
CO2: 25 mmol/L (ref 20–29)
Calcium: 10.1 mg/dL (ref 8.6–10.2)
Chloride: 105 mmol/L (ref 96–106)
Creatinine, Ser: 0.97 mg/dL (ref 0.76–1.27)
Glucose: 102 mg/dL — ABNORMAL HIGH (ref 70–99)
Potassium: 4.4 mmol/L (ref 3.5–5.2)
Sodium: 140 mmol/L (ref 134–144)
eGFR: 73 mL/min/{1.73_m2} (ref >59)

## 2021-10-10 ENCOUNTER — Other Ambulatory Visit: Payer: Self-pay

## 2021-10-10 ENCOUNTER — Ambulatory Visit (INDEPENDENT_AMBULATORY_CARE_PROVIDER_SITE_OTHER)
Admission: RE | Admit: 2021-10-10 | Discharge: 2021-10-10 | Disposition: A | Discharge status: Home / Self Care | Payer: Medicare PPO | Source: Ambulatory Visit | Attending: Cardiovascular Disease | Admitting: Cardiovascular Disease

## 2021-10-10 DIAGNOSIS — I71019 Dissection of thoracic aorta, unspecified: Secondary | ICD-10-CM

## 2021-10-10 DIAGNOSIS — I712 Thoracic aortic aneurysm, without rupture, unspecified: Secondary | ICD-10-CM | POA: Diagnosis not present

## 2021-10-10 DIAGNOSIS — I10 Essential (primary) hypertension: Secondary | ICD-10-CM

## 2021-10-10 DIAGNOSIS — I7121 Aneurysm of the ascending aorta, without rupture: Secondary | ICD-10-CM | POA: Diagnosis not present

## 2021-10-10 MED ORDER — IOHEXOL 350 MG/ML SOLN
80.0000 mL | Freq: Once | INTRAVENOUS | Status: AC | PRN
  Administered 2021-10-10: 80 mL via INTRAVENOUS

## 2021-10-13 ENCOUNTER — Telehealth: Payer: Self-pay

## 2021-10-13 NOTE — Telephone Encounter (Signed)
Pt made aware of results and need to keep scheduled appt with Dr Cyndia Bent

## 2021-10-25 ENCOUNTER — Ambulatory Visit: Payer: Medicare PPO | Admitting: Surgery

## 2021-10-25 ENCOUNTER — Other Ambulatory Visit: Payer: Self-pay

## 2021-10-25 VITALS — BP 138/76 | HR 67 | Resp 20 | Ht 63.0 in | Wt 128.0 lb

## 2021-10-25 DIAGNOSIS — C61 Malignant neoplasm of prostate: Secondary | ICD-10-CM | POA: Diagnosis not present

## 2021-10-25 DIAGNOSIS — I7121 Aneurysm of the ascending aorta, without rupture: Secondary | ICD-10-CM | POA: Diagnosis not present

## 2021-10-29 NOTE — Progress Notes (Signed)
? ? ?HPI: ? ?The patient is a 88 year old [place] gentleman who returns the office for follow-up of an ascending aortic aneurysm.  He had been followed by Dr. Roxy Manns after an acute type B aortic dissection in 2008.  The false lumen eventually thrombosed and completely healed.  Since then he was being followed with serial CTA due to moderate aneurysmal enlargement of the ascending aorta and transverse arch.  He was last seen by Dr. Roxy Manns on 02/08/2020.  Dr. Roxy Manns did not feel that he would be an operative candidate but agreed to continue following this every 2 years.  The patient reports feeling well and remaining active.  He denies any chest or back pain. ? ?Current Outpatient Medications  ?Medication Sig Dispense Refill  ? ascorbic acid (VITAMIN C) 1000 MG tablet Take 2,000 mg by mouth 3 (three) times daily.     ? aspirin 81 MG tablet Take 81 mg by mouth at bedtime.    ? atorvastatin (LIPITOR) 20 MG tablet TAKE 1 TABLET BY MOUTH EVERYDAY AT BEDTIME 90 tablet 3  ? Cholecalciferol (VITAMIN D-3 PO) Take 6,000 mg by mouth daily.    ? Choline Fenofibrate (FENOFIBRIC ACID) 135 MG CPDR Take 1 tablet by mouth daily. PLEASE CONTACT OFFICE FOR AN APPOINTMENT 90 capsule 2  ? COLLAGEN PO Take 6,000 mg by mouth daily.    ? esomeprazole (NEXIUM) 40 MG capsule Take 40 mg by mouth daily before breakfast.    ? finasteride (PROSCAR) 5 MG tablet Take 5 mg by mouth daily.    ? fish oil-omega-3 fatty acids 1000 MG capsule Take 1,000 mg by mouth daily.     ? labetalol (NORMODYNE) 100 MG tablet TAKE 1 TABLET BY MOUTH TWICE A DAY 180 tablet 1  ? latanoprost (XALATAN) 0.005 % ophthalmic solution Place 1 drop into both eyes at bedtime.     ? Multiple Vitamin (MULITIVITAMIN WITH MINERALS) TABS Take 1 tablet by mouth daily.    ? polyvinyl alcohol (LIQUIFILM TEARS) 1.4 % ophthalmic solution Place 1 drop into both eyes every 8 (eight) hours as needed for dry eyes.    ? QUERCETIN PO Take 1 tablet by mouth daily.    ? VITAMIN E PO Take 400 Units  by mouth daily.    ? ?No current facility-administered medications for this visit.  ? ? ? ?Physical Exam: ?BP 138/76   Pulse 67   Resp 20   Ht '5\' 3"'$  (1.6 m)   Wt 128 lb (58.1 kg)   SpO2 94% Comment: RA  BMI 22.67 kg/m?  ?He looks well. ?Cardiac exam shows a regular rate and rhythm with normal heart sounds.  There is no murmur. ?Lungs are clear. ?There is no peripheral edema ? ?Diagnostic Tests: ? ?Narrative & Impression  ?CLINICAL DATA:  Aortic aneurysm follow-up ?  ?EXAM: ?CT ANGIOGRAPHY CHEST WITH CONTRAST ?  ?TECHNIQUE: ?Multidetector CT imaging of the chest was performed using the ?standard protocol during bolus administration of intravenous ?contrast. Multiplanar CT image reconstructions and MIPs were ?obtained to evaluate the vascular anatomy. ?  ?RADIATION DOSE REDUCTION: This exam was performed according to the ?departmental dose-optimization program which includes automated ?exposure control, adjustment of the mA and/or kV according to ?patient size and/or use of iterative reconstruction technique. ?  ?CONTRAST:  72m OMNIPAQUE IOHEXOL 350 MG/ML SOLN ?  ?COMPARISON:  Prior CT scan the chest 02/08/2020; 10/26/09 ?  ?FINDINGS: ?Cardiovascular: Conventional 3 vessel arch anatomy. Dilated aortic ?root at 4.7 cm in diameter. The aortic valve  is thickened and mildly ?calcified. Stable fusiform aneurysmal dilation of the tubular ?portion of the ascending thoracic aorta at 4.7 cm. Focal penetrating ?atherosclerotic ulceration arising from the proximal descending ?thoracic aorta in the region of the aortic isthmus remains unchanged ?across numerous prior studies for the past 10 years. Maximal aortic ?diameter 4.2 cm. The remainder of the descending thoracic aorta is ?normal in caliber. Mild heterogeneous atherosclerotic plaque. ?Conventional 3 vessel arch anatomy. The heart is normal in size. No ?pericardial effusion. ?  ?Mediastinum/Nodes: Unremarkable CT appearance of the thyroid gland. ?No suspicious  mediastinal or hilar adenopathy. No soft tissue ?mediastinal mass. The thoracic esophagus is unremarkable. ?  ?Lungs/Pleura: Trace biapical pleuroparenchymal scarring. Mild ?subpleural reticulation and architectural distortion in the ?dependent portion of the lung bases, likely early fibrotic change. ?No suspicious pulmonary mass or nodule. No pleural effusion, ?pneumothorax or evidence of pulmonary edema. ?  ?Upper Abdomen: No acute abnormality. Small arterial perfusion defect ?in the right hepatic dome, unchanged over multiple prior studies. ?Simple cyst arising from the upper pole of the left kidney. ?  ?Musculoskeletal: No chest wall abnormality. No acute or significant ?osseous findings. ?  ?Review of the MIP images confirms the above findings. ?  ?IMPRESSION: ?1. Continued stability of aneurysmal dilation of the ascending ?thoracic aorta with a maximal diameter of 4.7 cm. Ascending thoracic ?aortic aneurysm. Recommend semi-annual imaging followup by CTA or ?MRA and referral to cardiothoracic surgery if not already obtained. ?This recommendation follows 2010 ?ACCF/AHA/AATS/ACR/ASA/SCA/SCAI/SIR/STS/SVM Guidelines for the ?Diagnosis and Management of Patients With Thoracic Aortic Disease. ?Circulation. 2010; 121: N562-Z308. Aortic aneurysm NOS (ICD10-I71.9) ?2. Continued stability of small focal atherosclerotic ulceration ?arising from the proximal descending thoracic aorta in the reason of ?the aortic isthmus. This remains relatively unchanged for greater ?than 10 years. ?3. Thickened and calcified aortic valve. Question possible ?underlying aortic stenosis. ?4. Aortic and coronary artery atherosclerotic vascular ?calcifications. ?  ?Aortic Atherosclerosis (ICD10-I70.0). ?  ?Signed, ?  ?Criselda Peaches, MD, RPVI ?  ?[facility] ?  ?[facility] ?  ?  ?Electronically Signed ?  By: Jacqulynn Cadet M.D. ?  On: 10/11/2021 06:25 ?   ? ? ?Impression: ? ?This  88 year old gentleman has a stable 4.7 cm fusiform ascending aortic aneurysm.  He has continued stability of a small focal atherosclerotic ulceration in the proximal descending thoracic aorta in the area of the aortic isthmus that has been unchanged for greater than 10 years.  His previous type B aortic dissection is completely healed.  I reviewed the CTA images with him and answered his questions.  Given his age of 72 years I do not think he would be a candidate for aortic surgery even if this did increase in size to the operative threshold of 5.5 cm.  I think it is very unlikely to do so in his lifetime.  I discussed this with him.  I do not think there is a good indication for performing more CTA studies since the risk of contrast is higher than the benefit of the study at his age.  I discussed this with him and he is in agreement.  I discussed the importance of continued good blood pressure control in preventing further enlargement and acute aortic dissection. ? ?Plan: ? ?He will continue to follow-up with his PCP. ? ? ?Gaye Pollack, MD ?[facility] ?[phone] ? ? ? ? ? ? ?

## 2021-11-07 DIAGNOSIS — E7841 Elevated Lipoprotein(a): Secondary | ICD-10-CM | POA: Diagnosis not present

## 2021-11-07 DIAGNOSIS — Z125 Encounter for screening for malignant neoplasm of prostate: Secondary | ICD-10-CM | POA: Diagnosis not present

## 2021-11-07 DIAGNOSIS — I10 Essential (primary) hypertension: Secondary | ICD-10-CM | POA: Diagnosis not present

## 2021-11-14 DIAGNOSIS — Z1339 Encounter for screening examination for other mental health and behavioral disorders: Secondary | ICD-10-CM | POA: Diagnosis not present

## 2021-11-14 DIAGNOSIS — R82998 Other abnormal findings in urine: Secondary | ICD-10-CM | POA: Diagnosis not present

## 2021-11-14 DIAGNOSIS — Z0000 Encounter for general adult medical examination without abnormal findings: Secondary | ICD-10-CM | POA: Diagnosis not present

## 2021-11-14 DIAGNOSIS — Z86010 Personal history of colonic polyps: Secondary | ICD-10-CM | POA: Diagnosis not present

## 2021-11-14 DIAGNOSIS — K219 Gastro-esophageal reflux disease without esophagitis: Secondary | ICD-10-CM | POA: Diagnosis not present

## 2021-11-14 DIAGNOSIS — C61 Malignant neoplasm of prostate: Secondary | ICD-10-CM | POA: Diagnosis not present

## 2021-11-14 DIAGNOSIS — I10 Essential (primary) hypertension: Secondary | ICD-10-CM | POA: Diagnosis not present

## 2021-11-14 DIAGNOSIS — Z1331 Encounter for screening for depression: Secondary | ICD-10-CM | POA: Diagnosis not present

## 2021-11-14 DIAGNOSIS — E7841 Elevated Lipoprotein(a): Secondary | ICD-10-CM | POA: Diagnosis not present

## 2021-11-22 ENCOUNTER — Other Ambulatory Visit: Payer: Self-pay | Admitting: Cardiovascular Disease

## 2022-03-07 ENCOUNTER — Other Ambulatory Visit: Payer: Self-pay | Admitting: Cardiovascular Disease

## 2022-03-07 ENCOUNTER — Other Ambulatory Visit: Payer: Self-pay

## 2022-03-07 DIAGNOSIS — Z20822 Contact with and (suspected) exposure to covid-19: Secondary | ICD-10-CM

## 2022-03-07 NOTE — Progress Notes (Signed)
Blood draw CMP, cbc with diff per Dr. Aggie Moats. TM

## 2022-03-08 ENCOUNTER — Other Ambulatory Visit (INDEPENDENT_AMBULATORY_CARE_PROVIDER_SITE_OTHER): Payer: Self-pay | Admitting: Family Medicine

## 2022-03-08 DIAGNOSIS — U071 COVID-19: Secondary | ICD-10-CM

## 2022-03-08 LAB — COMPREHENSIVE METABOLIC PANEL
ALT: 17 IU/L (ref 0–44)
AST: 25 IU/L (ref 0–40)
Albumin/Globulin Ratio: 1.7 (ref 1.2–2.2)
Albumin: 4.7 g/dL — ABNORMAL HIGH (ref 3.6–4.6)
Alkaline Phosphatase: 45 IU/L (ref 44–121)
BUN/Creatinine Ratio: 26 — ABNORMAL HIGH (ref 10–24)
BUN: 22 mg/dL (ref 10–36)
Bilirubin Total: 0.6 mg/dL (ref 0.0–1.2)
CO2: 24 mmol/L (ref 20–29)
Calcium: 9.8 mg/dL (ref 8.6–10.2)
Chloride: 108 mmol/L — ABNORMAL HIGH (ref 96–106)
Creatinine, Ser: 0.85 mg/dL (ref 0.76–1.27)
Globulin, Total: 2.7 g/dL (ref 1.5–4.5)
Glucose: 87 mg/dL (ref 70–99)
Potassium: 4.7 mmol/L (ref 3.5–5.2)
Sodium: 143 mmol/L (ref 134–144)
Total Protein: 7.4 g/dL (ref 6.0–8.5)
eGFR: 81 mL/min/{1.73_m2} (ref >59)

## 2022-03-08 LAB — CBC WITH DIFFERENTIAL/PLATELET
Basophils Absolute: 0 10*3/uL (ref 0.0–0.2)
Basos: 1 %
EOS (ABSOLUTE): 0.1 10*3/uL (ref 0.0–0.4)
Eos: 1 %
Hematocrit: 43.6 % (ref 37.5–51.0)
Hemoglobin: 13.9 g/dL (ref 13.0–17.7)
Immature Grans (Abs): 0 10*3/uL (ref 0.0–0.1)
Immature Granulocytes: 0 %
Lymphocytes Absolute: 2.1 10*3/uL (ref 0.7–3.1)
Lymphs: 34 %
MCH: 32 pg (ref 26.6–33.0)
MCHC: 31.9 g/dL (ref 31.5–35.7)
MCV: 100 fL — ABNORMAL HIGH (ref 79–97)
Monocytes Absolute: 0.6 10*3/uL (ref 0.1–0.9)
Monocytes: 10 %
Neutrophils Absolute: 3.5 10*3/uL (ref 1.4–7.0)
Neutrophils: 54 %
Platelets: 286 10*3/uL (ref 150–450)
RBC: 4.35 x10E6/uL (ref 4.14–5.80)
RDW: 11.9 % (ref 11.6–15.4)
WBC: 6.3 10*3/uL (ref 3.4–10.8)

## 2022-03-08 LAB — SPECIMEN STATUS REPORT

## 2022-03-08 MED ORDER — NIRMATRELVIR/RITONAVIR (PAXLOVID)TABLET: 3.0000 | ORAL_TABLET | Freq: Two times a day (BID) | ORAL | 0 refills | Status: AC

## 2022-03-08 NOTE — Telephone Encounter (Signed)
Cepheid PCR pos for COVID, prev neg antigen test. Conversation between myself, office staff, pt and family. PT has had mild to no sx for last few days. Wife was pos on antigen testing and is on Paxlovid. Checked GFR which is 81. Sent paxlovid in for patient.  Elwin Mocha, MD

## 2022-03-09 ENCOUNTER — Other Ambulatory Visit (INDEPENDENT_AMBULATORY_CARE_PROVIDER_SITE_OTHER): Payer: Self-pay | Admitting: Family Medicine

## 2022-03-09 DIAGNOSIS — U071 COVID-19: Secondary | ICD-10-CM

## 2022-03-09 DIAGNOSIS — R051 Acute cough: Secondary | ICD-10-CM

## 2022-03-09 MED ORDER — BENZONATATE 100 MG PO CAPS: 100.0000 mg | ORAL_CAPSULE | Freq: Two times a day (BID) | ORAL | 0 refills | Status: DC | PRN

## 2022-03-09 NOTE — Telephone Encounter (Signed)
Pt with cough. Doing well. No SOB. Medicine to be delivered with mucinex and chloraseptic.  Elwin Mocha, MD .

## 2022-03-10 ENCOUNTER — Other Ambulatory Visit: Payer: Self-pay | Admitting: Cardiovascular Disease

## 2022-03-12 ENCOUNTER — Telehealth: Payer: Self-pay | Admitting: Cardiovascular Disease

## 2022-03-12 MED ORDER — LABETALOL HCL 100 MG PO TABS: 100.0000 mg | ORAL_TABLET | Freq: Two times a day (BID) | ORAL | 1 refills | Status: DC

## 2022-03-12 NOTE — Telephone Encounter (Signed)
*  STAT* If patient is at the pharmacy, call can be transferred to refill team.   1. Which medications need to be refilled? (please list name of each medication and dose if known)  labetalol (NORMODYNE) 100 MG tablet  2. Which pharmacy/location (including street and city if local pharmacy) is medication to be sent to? [id number]- [place], [place] - [address] AT [address]. Do they need a 30 day or 90 day supply? 90  with refills  Patient is out of medication

## 2022-03-12 NOTE — Telephone Encounter (Signed)
Pt's medication was sent to pt's pharmacy as requested. Confirmation received.  °

## 2022-03-20 ENCOUNTER — Other Ambulatory Visit: Payer: Self-pay | Admitting: Family Medicine

## 2022-03-20 DIAGNOSIS — U071 COVID-19: Secondary | ICD-10-CM

## 2022-03-20 DIAGNOSIS — R051 Acute cough: Secondary | ICD-10-CM

## 2022-03-20 MED ORDER — GUAIFENESIN ER 600 MG PO TB12: 600.0000 mg | ORAL_TABLET | Freq: Two times a day (BID) | ORAL | 0 refills | Status: AC

## 2022-03-20 NOTE — Telephone Encounter (Signed)
Pt req mucinex script, this was sent in. Tested pos for COVID, see note.  Elwin Mocha, MD

## 2022-03-26 ENCOUNTER — Ambulatory Visit: Payer: Medicare PPO | Admitting: Cardiovascular Disease

## 2022-04-08 ENCOUNTER — Encounter: Payer: Self-pay | Admitting: Cardiovascular Disease

## 2022-04-08 NOTE — Progress Notes (Signed)
Cardiology Office Note   Date:  04/09/2022   ID:  Dwayne Morris, DOB [birth year], MRN [id number]  PCP:  Elwin Mocha, MD  Cardiologist:   [name], MD   Chief Complaint  Patient presents with   Hypertension   ruptured aortic aneurism   1. Type III aortic dissection 2. Hypertension 3. Prostate cancer 4. 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  gentleman originally from [place]. He is done quite well since I last saw him 6 months ago. [name] not had any problems with his blood pressure. [name] been taking his medications the records his blood pressure on a regular basis.  He has a history of an aortic dissection. [name] not had any further episodes of chest pain. We have followed him with CT scans and his most recent CT scans have not shown any worsening of his dissection.  February 24, 2013:  Dwayne Morris is doing well.  Still swiimming regularly.  No CP , no dyspnea.   His CT angio of the aorta which was unremarkable.    He has been found to have a left inguinal hernia.   Jan. 12, 2015:  Dwayne Morris is doing well.  He has been to [place] since last saw him.  Still swimming reguarly.    April 14, 2014:  Doing well.  No CP or issues.  Still swimming regulalry.    He has noticed that on occasion is lower than 100 - he typically holds his labetalol .  The following morning the blood pressures usually normal.   Feb. 18, 2016:   Dwayne Morris is a 88 y.o. male who presents for follow up of his aortic dissection.  His home BP readings are all good.  He swims 1/2 mile a day 3 days a week.  Walks quite a bit.  Also is active in his gardent  Has some occasional back pain if he works in his garden  Sept. 8, 2016:  Doing well.  Has some "stuffiness in his chest "  ,  Seems to be more pronounced if he is stress.  Goes away when he walks around the block   We discussed his vertebral artery dissection that occurred 20 years ago .  was hospitalized . Still has some  left sided weakness adn "stiffness"   His son , [name], noticed that he occasionally has some facial drooping . He has discussed with Dr. Maudie Mercury.   Dr. Maudie Mercury suggest   Brought his BP log.  All his readings are normal  Oct. 25, 2016:  Dwayne Morris is seen today for some chest discomfort. Chest pressure , minor Feels better after swimming or walking . Not worsened with deep breath ( in fact, makes it feel better ) Not worsened with arm movement  calastinics seem to help   Mostly on the left side.  Not associated with sweats or dyspnea.  Is scheduled to have colonoscopy on Nov. 4 ( Dr. Benson Norway)  [facility] to hold his ASA for a week prior to colonoscopy . Has had polys removed in the past - complicated by a lower GI bleed. Apparently a clip had come loose.   December 20, 2015:  Dwayne Morris is doing well .  BP is stable.   Brought his BP log.  Swimming regularly   Oct. 23, 2017: Feeling well Still swimming 3 times a week - 1/2 mile .  Also walks 3 miles 3 times a  week.  Brought his BP log .  Dec 26, 2016: Dwayne Morris is doing well Swims regularly .   Walks 3 miles a day also  No CP or dyspnea.  BP records were reviewed .  Was seen by Dr. Roxy Manns .  CT at that time showed that the dissection was completely healed.   Jan 15, 2018:   Doing well Shared recent news about his son [name]. BP is great. Brought his BP log  Shared a bottle of  Merlot with me   Nov. 18, 2019:    Doing great We disscussed his son's recent achievements  Feels well  No further CP or abd pain  Eating and drinking regularly  Still swimming .   November 17, 2019:  Dwayne Morris is doing well.  Is no longer swimming,   Walks  3 miles a day . Is seeing Dr. Roxy Manns every 2 years .  Brought his BP log  Has had both covid vaccines.   Sept. 20, 2021 Dwayne Morris is doing very well.  Walks 3 miles a day , has had some left lateral leg pain .    Brought his i BP log . Bp readings look good He needs a new primary MD ( Dr. Maudie Mercury is out on medical leave )   November 14, 2020: Dwayne Morris  is doing well.  He remains very active, Has been walking instead of swimming ( covid restrictions ) Pulled a muscle on 1 occasion,  Has healed.   His las CTA of the chest / abd.   His asc. Aorta measures 4.5 cm   Jan. 27, 2023 Dwayne Morris is seen today for follow up of his HTN, type III aortic dissection 1-2 months ago, he developed a dull mid-sternal chest pain .  He called for appt .  Now the pain is better .  The pain did not feel like his aortic dissection pain  ( Previous pain was sharp lasting mid scapular pain  Is not swimming due to [place]  Now he walks several miles a day 4-5 days a week .  Walks late at night  Also does some exercises in his home .    Aug. 14, 2023 Dwayne Morris is seen today for follow up of his type III aortic dissection, HTN Has been seen by Dr. Cyndia [facility] at [facility]   No Cp or dyspnea  Takes all his meds  Is not swimnning now due to [place] concerns.  Is exercising in the pool  Is walking regularly    Has some vague MSK pain  Advised tylenol or ibuprofen       Past Medical History:  Diagnosis Date   Aortic dissection ([place])    type B aortic dissection in November 2008   Hypertension    Prostate cancer ([place])    PSA levels have decreased with garlic therapy   Stroke [facility]) 1996   History of right vertebral artery dissection with stroke   Thoracic aortic aneurysm [facility])     Past Surgical History:  Procedure Laterality Date   arotic discection      2012    COLONOSCOPY  09/14/2011   Procedure: COLONOSCOPY;  Surgeon: Beryle Beams, MD;  Location: [facility];  Service: Endoscopy;  Laterality: N/A;   COLONOSCOPY WITH PROPOFOL N/A 07/01/2015   Procedure: COLONOSCOPY WITH PROPOFOL;  Surgeon: Carol Ada, MD;  Location: [facility];  Service: Endoscopy;  Laterality: N/A;   EYE SURGERY     catract     Current Outpatient Medications  Medication  Sig Dispense Refill   ascorbic acid (VITAMIN C) 1000 MG tablet Take 2,000 mg by mouth 3 (three) times daily.      aspirin  81 MG tablet Take 81 mg by mouth at bedtime.     atorvastatin (LIPITOR) 20 MG tablet TAKE 1 TABLET BY MOUTH EVERYDAY AT BEDTIME 90 tablet 3   Cholecalciferol (VITAMIN D-3 PO) Take 6,000 mg by mouth daily.     Choline Fenofibrate (FENOFIBRIC ACID) 135 MG CPDR Take 1 tablet by mouth daily. PLEASE CONTACT OFFICE FOR AN APPOINTMENT 90 capsule 2   COLLAGEN PO Take 6,000 mg by mouth daily.     finasteride (PROSCAR) 5 MG tablet Take 5 mg by mouth daily.     fish oil-omega-3 fatty acids 1000 MG capsule Take 1,000 mg by mouth daily.      labetalol (NORMODYNE) 100 MG tablet Take 1 tablet (100 mg total) by mouth 2 (two) times daily. 180 tablet 1   latanoprost (XALATAN) 0.005 % ophthalmic solution Place 1 drop into both eyes at bedtime.      Multiple Vitamin (MULITIVITAMIN WITH MINERALS) TABS Take 1 tablet by mouth daily.     polyvinyl alcohol (LIQUIFILM TEARS) 1.4 % ophthalmic solution Place 1 drop into both eyes every 8 (eight) hours as needed for dry eyes.     VITAMIN E PO Take 400 Units by mouth daily.     esomeprazole (NEXIUM) 40 MG capsule Take 40 mg by mouth daily before breakfast. (Patient not taking: Reported on 04/09/2022)     Quercetin 250 MG TABS 2 tablets Orally Once a day (Patient not taking: Reported on 04/09/2022)     No current facility-administered medications for this visit.    Allergies:   Patient has no known allergies.    Social History:  The patient  reports that he quit smoking about 47 years ago. His smoking use included cigarettes. He has a 30.00 pack-year smoking history. He has never used smokeless tobacco. He reports current alcohol use of about 4.0 standard drinks of alcohol per week. He reports that he does not use drugs.   Family History:  The patient's family history is not on file.    ROS:   Noted in current hx   Physical Exam: Blood pressure 120/64, pulse 65, height '5\' 3"'$  (1.6 m), weight 128 lb 6.4 oz (58.2 kg), SpO2 96 %.  GEN:  Well nourished, well developed in  no acute distress HEENT: Normal NECK: No JVD; No carotid bruits LYMPHATICS: No lymphadenopathy CARDIAC: RRR , no murmurs, rubs, gallops RESPIRATORY:  Clear to auscultation without rales, wheezing or rhonchi  ABDOMEN: Soft, non-tender, non-distended MUSCULOSKELETAL:  No edema; No deformity  SKIN: Warm and dry NEUROLOGIC:  Alert and oriented x 3     EKG:         Aug. 14, 2023.  NSR at 65. 1st degree AV block  Inc. RBBB    Recent Labs: 03/07/2022: ALT 17; BUN 22; Creatinine, Ser 0.85; Hemoglobin 13.9; Platelets 286; Potassium 4.7; Sodium 143    Lipid Panel    Component Value Date/Time   CHOL 126 12/24/2016 1621   TRIG 47 12/24/2016 1621   HDL 61 12/24/2016 1621   CHOLHDL 2.1 12/24/2016 1621   CHOLHDL 1.9 11/24/2015 1541   VLDL 11 11/24/2015 1541   LDLCALC 56 12/24/2016 1621      Wt Readings from Last 3 Encounters:  04/09/22 128 lb 6.4 oz (58.2 kg)  10/25/21 128 lb (58.1 kg)  09/22/21 127 lb 12.8  oz (58 kg)      Other studies Reviewed: Additional studies/ records that were reviewed today include: . Review of the above records demonstrates:       ASSESSMENT AND PLAN:  1. Type III aortic dissection -   his dissection seems to have healed up nicely.  We will keep his blood pressure under good control.  He is also seeing Dr. Cyndia Bent at the thoracic surgeons office.  2. Hypertension  -blood pressure is well controlled.  Continue current medications.    3. Prostate cancer -  Follows up with Dr. Jeffie Pollock    Current medicines are reviewed at length with the patient today.  The patient does not have concerns regarding medicines.  The following changes have been made:  no change   Disposition:    will see him in 6 months    Signed, [name], MD  04/09/2022 5:35 PM    [facility] [address], [place], [place]  [address] Phone: [phone]; Fax: [phone]

## 2022-04-09 ENCOUNTER — Ambulatory Visit: Payer: Medicare PPO | Admitting: Cardiovascular Disease

## 2022-04-09 ENCOUNTER — Encounter: Payer: Self-pay | Admitting: Cardiovascular Disease

## 2022-04-09 VITALS — BP 120/64 | HR 65 | Ht 63.0 in | Wt 128.4 lb

## 2022-04-09 DIAGNOSIS — I71019 Dissection of thoracic aorta, unspecified: Secondary | ICD-10-CM

## 2022-04-09 DIAGNOSIS — I10 Essential (primary) hypertension: Secondary | ICD-10-CM | POA: Diagnosis not present

## 2022-04-09 NOTE — Patient Instructions (Signed)
Medication Instructions:  Your physician recommends that you continue on your current medications as directed. Please refer to the Current Medication list given to you today.  *If you need a refill on your cardiac medications before your next appointment, please call your pharmacy*   Lab Work: NONE If you have labs (blood work) drawn today and your tests are completely normal, you will receive your results only by: MyChart Message (if you have MyChart) OR A paper copy in the mail If you have any lab test that is abnormal or we need to change your treatment, we will call you to review the results.   Testing/Procedures: NONE   Follow-Up: At [facility], you and your health needs are our priority.  As part of our continuing mission to provide you with exceptional heart care, we have created designated Provider Care Teams.  These Care Teams include your primary Cardiologist (physician) and Advanced Practice Providers (APPs -  Physician Assistants and Nurse Practitioners) who all work together to provide you with the care you need, when you need it.  We recommend signing up for the patient portal called "MyChart".  Sign up information is provided on this After Visit Summary.  MyChart is used to connect with patients for Virtual Visits (Telemedicine).  Patients are able to view lab/test results, encounter notes, upcoming appointments, etc.  Non-urgent messages can be sent to your provider as well.   To learn more about what you can do with MyChart, go to [email or web address].    Your next appointment:   6 month(s)  The format for your next appointment:   In Person  Provider:   Philip Nahser, MD     Important Information About Sugar       

## 2022-05-03 ENCOUNTER — Other Ambulatory Visit: Payer: Self-pay | Admitting: Cardiovascular Disease

## 2022-05-09 DIAGNOSIS — C61 Malignant neoplasm of prostate: Secondary | ICD-10-CM | POA: Diagnosis not present

## 2022-07-04 DIAGNOSIS — N401 Enlarged prostate with lower urinary tract symptoms: Secondary | ICD-10-CM | POA: Diagnosis not present

## 2022-07-04 DIAGNOSIS — R351 Nocturia: Secondary | ICD-10-CM | POA: Diagnosis not present

## 2022-07-04 DIAGNOSIS — C61 Malignant neoplasm of prostate: Secondary | ICD-10-CM | POA: Diagnosis not present

## 2022-10-07 ENCOUNTER — Encounter: Payer: Self-pay | Admitting: Cardiovascular Disease

## 2022-10-07 NOTE — Progress Notes (Signed)
Cardiology Office Note   Date:  10/08/2022   ID:  Dwayne Morris, DOB 1926-10-06, MRN [id number]  PCP:  Dwayne Mocha, MD   Cardiologist:   Dwayne Moores, MD   Chief Complaint  Patient presents with   Hypertension        Thoracic Aortic Dissection   Hyperlipidemia   1. Type III aortic dissection 2. Hypertension 3. Prostate cancer 4. 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  gentleman originally from [place]. He is done quite well since I last saw him 6 months ago. [name] not had any problems with his blood pressure. [name] been taking his medications the records his blood pressure on a regular basis.  He has a history of an aortic dissection. [name] not had any further episodes of chest pain. We have followed him with CT scans and his most recent CT scans have not shown any worsening of his dissection.  February 24, 2013:  Dwayne Morris is doing well.  Still swiimming regularly.  No CP , no dyspnea.   His CT angio of the aorta which was unremarkable.    He has been found to have a left inguinal hernia.   Jan. 12, 2015:  Dwayne Morris is doing well.  He has been to [place] since last saw him.  Still swimming reguarly.    April 14, 2014:  Doing well.  No CP or issues.  Still swimming regulalry.    He has noticed that on occasion is lower than 100 - he typically holds his labetalol .  The following morning the blood pressures usually normal.   Feb. 18, 2016:   Dwayne Morris is a 89 y.o. male who presents for follow up of his aortic dissection.  His home BP readings are all good.  He swims 1/2 mile a day 3 days a week.  Walks quite a bit.  Also is active in his gardent  Has some occasional back pain if he works in his garden  Sept. 8, 2016:  Doing well.  Has some "stuffiness in his chest "  ,  Seems to be more pronounced if he is stress.  Goes away when he walks around the block   We discussed his vertebral artery dissection that occurred 20 years ago .  was  hospitalized . Still has some left sided weakness adn "stiffness"   His son , Dwayne Morris, noticed that he occasionally has some facial drooping . He has discussed with Dwayne Morris.   Dwayne Morris suggest   Brought his BP log.  All his readings are normal  Oct. 25, 2016:  Dwayne Morris is seen today for some chest discomfort. Chest pressure , minor Feels better after swimming or walking . Not worsened with deep breath ( in fact, makes it feel better ) Not worsened with arm movement  calastinics seem to help   Mostly on the left side.  Not associated with sweats or dyspnea.  Is scheduled to have colonoscopy on Nov. 4 ( Dwayne Morris)  Dwayne Morris to hold his ASA for a week prior to colonoscopy . Has had polys removed in the past - complicated by a lower GI bleed. Apparently a clip had come loose.   December 20, 2015:  Dwayne Morris is doing well .  BP is stable.   Brought his BP log.  Swimming regularly   Oct. 23, 2017: Feeling well Still swimming 3 times a week - 1/2 mile .  Also walks 3 miles 3 times a week.  Brought his BP log .  Dec 26, 2016: Dwayne Morris is doing well Swims regularly .   Walks 3 miles a day also  No CP or dyspnea.  BP records were reviewed .  Was seen by Dwayne Morris .  CT at that time showed that the dissection was completely healed.   Jan 15, 2018:   Doing well Shared recent news about his son Dwayne Morris. BP is great. Brought his BP log  Shared a bottle of  Merlot with me   Nov. 18, 2019:    Doing great We disscussed his son's recent achievements  Feels well  No further CP or abd pain  Eating and drinking regularly  Still swimming .   November 17, 2019:  Dwayne Morris is doing well.  Is no longer swimming,   Walks  3 miles a day . Is seeing Dwayne Morris every 2 years .  Brought his BP log  Has had both covid vaccines.   Sept. 20, 2021 Dwayne Morris is doing very well.  Walks 3 miles a day , has had some left lateral leg pain .    Brought his i BP log . Bp readings look good He needs a new primary MD ( Dwayne Morris is out on medical  leave )   November 14, 2020: Dwayne Morris is doing well.  He remains very active, Has been walking instead of swimming ( covid restrictions ) Pulled a muscle on 1 occasion,  Has healed.   His las CTA of the chest / abd.   His asc. Aorta measures 4.5 cm   Jan. 27, 2023 Dwayne Morris is seen today for follow up of his HTN, type III aortic dissection 1-2 months ago, he developed a dull mid-sternal chest pain .  He called for appt .  Now the pain is better .  The pain did not feel like his aortic dissection pain  ( Previous pain was sharp lasting mid scapular pain  Is not swimming due to [place]  Now he walks several miles a day 4-5 days a week .  Walks late at night  Also does some exercises in his home .    Aug. 14, 2023 Dwayne Morris is seen today for follow up of his type III aortic dissection, HTN Has been seen by Dwayne Morris at [facility]   No Cp or dyspnea  Takes all his meds  Is not swimnning now due to [place] concerns.  Is exercising in the pool  Is walking regularly    Has some vague MSK pain  Advised tylenol or ibuprofen   Oct 08, 2022 Dwayne Morris is seen for follow up of his aortic dissection , HTN Is back swimming 3 times a week , used to swim 1/2 mile ( 18 laps)  Now swims 15 laps ( 30 lengths of a 25 meter pool )  Walks 2-3 times a week.   Rarely has a glass of wine  - perhaps every other day       Past Medical History:  Diagnosis Date   Aortic dissection ([place])    type B aortic dissection in November 2008   Hypertension    Prostate cancer ([place])    PSA levels have decreased with garlic therapy   Stroke [facility]) 1996   History of right vertebral artery dissection with stroke   Thoracic aortic aneurysm [facility])     Past Surgical History:  Procedure Laterality Date   arotic discection  2012    COLONOSCOPY  09/14/2011   Procedure: COLONOSCOPY;  Surgeon: Beryle Beams, MD;  Location: [facility];  Service: Endoscopy;  Laterality: N/A;   COLONOSCOPY WITH PROPOFOL N/A 07/01/2015   Procedure:  COLONOSCOPY WITH PROPOFOL;  Surgeon: Carol Ada, MD;  Location: [facility];  Service: Endoscopy;  Laterality: N/A;   EYE SURGERY     catract     Current Outpatient Medications  Medication Sig Dispense Refill   ascorbic acid (VITAMIN C) 1000 MG tablet Take 2,000 mg by mouth 3 (three) times daily.      aspirin 81 MG tablet Take 81 mg by mouth at bedtime.     atorvastatin (LIPITOR) 20 MG tablet TAKE 1 TABLET BY MOUTH EVERYDAY AT BEDTIME 90 tablet 3   Cholecalciferol (VITAMIN D-3 PO) Take 6,000 mg by mouth daily.     Choline Fenofibrate (FENOFIBRIC ACID) 135 MG CPDR TAKE 1 TABLET BY MOUTH DAILY. PLEASE CONTACT OFFICE FOR AN APPOINTMENT 90 capsule 1   COLLAGEN PO Take 6,000 mg by mouth daily.     Cyanocobalamin (VITAMIN B-12 PO) Take 1 capsule by mouth daily in the afternoon.     finasteride (PROSCAR) 5 MG tablet Take 5 mg by mouth daily.     fish oil-omega-3 fatty acids 1000 MG capsule Take 1,000 mg by mouth daily.      labetalol (NORMODYNE) 100 MG tablet Take 1 tablet (100 mg total) by mouth 2 (two) times daily. 180 tablet 1   latanoprost (XALATAN) 0.005 % ophthalmic solution Place 1 drop into both eyes at bedtime.      Multiple Vitamin (MULITIVITAMIN WITH MINERALS) TABS Take 1 tablet by mouth daily.     polyvinyl alcohol (LIQUIFILM TEARS) 1.4 % ophthalmic solution Place 1 drop into both eyes every 8 (eight) hours as needed for dry eyes.     VITAMIN E PO Take 400 Units by mouth daily.     esomeprazole (NEXIUM) 40 MG capsule Take 40 mg by mouth daily before breakfast. (Patient not taking: Reported on 04/09/2022)     Quercetin 250 MG TABS 2 tablets Orally Once a day (Patient not taking: Reported on 04/09/2022)     No current facility-administered medications for this visit.    Allergies:   Patient has no known allergies.    Social History:  The patient  reports that he quit smoking about 48 years ago. His smoking use included cigarettes. He has a 30.00 pack-year smoking history. He has  never used smokeless tobacco. He reports current alcohol use of about 4.0 standard drinks of alcohol per week. He reports that he does not use drugs.   Family History:  The patient's family history is not on file.    ROS:   Noted in current hx    Physical Exam: Blood pressure 106/60, pulse 69, height 5' 3"$  (1.6 m), weight 127 lb 6.4 oz (57.8 kg), SpO2 96 %.       GEN:  Well nourished, well developed in no acute distress HEENT: Normal NECK: No JVD; No carotid bruits LYMPHATICS: No lymphadenopathy CARDIAC: RRR , no murmurs, rubs, gallops RESPIRATORY:  Clear to auscultation without rales, wheezing or rhonchi  ABDOMEN: Soft, non-tender, non-distended MUSCULOSKELETAL:  No edema; No deformity  SKIN: Warm and dry NEUROLOGIC:  Alert and oriented x 3     EKG:            Recent Labs: 03/07/2022: ALT 17; BUN 22; Creatinine, Ser 0.85; Hemoglobin 13.9; Platelets 286; Potassium 4.7; Sodium 143  Lipid Panel    Component Value Date/Time   CHOL 126 12/24/2016 1621   TRIG 47 12/24/2016 1621   HDL 61 12/24/2016 1621   CHOLHDL 2.1 12/24/2016 1621   CHOLHDL 1.9 11/24/2015 1541   VLDL 11 11/24/2015 1541   LDLCALC 56 12/24/2016 1621      Wt Readings from Last 3 Encounters:  10/08/22 127 lb 6.4 oz (57.8 kg)  04/09/22 128 lb 6.4 oz (58.2 kg)  10/25/21 128 lb (58.1 kg)      Other studies Reviewed: Additional studies/ records that were reviewed today include: . Review of the above records demonstrates:       ASSESSMENT AND PLAN:  1. Type III aortic dissection -     very stable ,  no CP . BP is well controlled.    2. Hypertension  - cont Labetalol  He keeps very detailed records of his BP    3. Prostate cancer -  Follows up with Dr. Jeffie Pollock    Current medicines are reviewed at length with the patient today.  The patient does not have concerns regarding medicines.  The following changes have been made:  no change  Disposition:    will see him in 6 months     Signed, Dwayne Moores, MD  10/08/2022 3:58 PM    [address], [place], [place]  [address] Phone: [phone]; Fax: [phone]

## 2022-10-08 ENCOUNTER — Ambulatory Visit: Payer: Medicare PPO | Attending: Cardiovascular Disease | Admitting: Cardiovascular Disease

## 2022-10-08 ENCOUNTER — Encounter: Payer: Self-pay | Admitting: Cardiovascular Disease

## 2022-10-08 VITALS — BP 106/60 | HR 69 | Ht 63.0 in | Wt 127.4 lb

## 2022-10-08 DIAGNOSIS — I71019 Dissection of thoracic aorta, unspecified: Secondary | ICD-10-CM | POA: Diagnosis not present

## 2022-10-08 DIAGNOSIS — I10 Essential (primary) hypertension: Secondary | ICD-10-CM | POA: Diagnosis not present

## 2022-10-08 NOTE — Patient Instructions (Signed)
Medication Instructions:  Your physician recommends that you continue on your current medications as directed. Please refer to the Current Medication list given to you today.  *If you need a refill on your cardiac medications before your next appointment, please call your pharmacy*   Lab Work: NONE If you have labs (blood work) drawn today and your tests are completely normal, you will receive your results only by: [place] (if you have MyChart) OR A paper copy in the mail If you have any lab test that is abnormal or we need to change your treatment, we will call you to review the results.   Testing/Procedures: NONE   Follow-Up: At [facility], you and your health needs are our priority.  As part of our continuing mission to provide you with exceptional heart care, we have created designated Provider Care Teams.  These Care Teams include your primary Cardiologist (physician) and Advanced Practice Providers (APPs -  Physician Assistants and Nurse Practitioners) who all work together to provide you with the care you need, when you need it.  We recommend signing up for the patient portal called "MyChart".  Sign up information is provided on this After Visit Summary.  MyChart is used to connect with patients for Virtual Visits (Telemedicine).  Patients are able to view lab/test results, encounter notes, upcoming appointments, etc.  Non-urgent messages can be sent to your provider as well.   To learn more about what you can do with MyChart, go to [email or web address].    Your next appointment:   6 month(s)  Provider:   [name], MD

## 2022-10-24 ENCOUNTER — Other Ambulatory Visit: Payer: Self-pay | Admitting: Cardiovascular Disease

## 2022-11-26 DIAGNOSIS — H353132 Nonexudative age-related macular degeneration, bilateral, intermediate dry stage: Secondary | ICD-10-CM | POA: Diagnosis not present

## 2022-11-26 DIAGNOSIS — H524 Presbyopia: Secondary | ICD-10-CM | POA: Diagnosis not present

## 2022-11-26 DIAGNOSIS — H52203 Unspecified astigmatism, bilateral: Secondary | ICD-10-CM | POA: Diagnosis not present

## 2022-11-26 DIAGNOSIS — H401132 Primary open-angle glaucoma, bilateral, moderate stage: Secondary | ICD-10-CM | POA: Diagnosis not present

## 2022-11-26 DIAGNOSIS — H04123 Dry eye syndrome of bilateral lacrimal glands: Secondary | ICD-10-CM | POA: Diagnosis not present

## 2022-11-26 DIAGNOSIS — H35373 Puckering of macula, bilateral: Secondary | ICD-10-CM | POA: Diagnosis not present

## 2022-11-27 DIAGNOSIS — I10 Essential (primary) hypertension: Secondary | ICD-10-CM | POA: Diagnosis not present

## 2022-11-27 DIAGNOSIS — Z125 Encounter for screening for malignant neoplasm of prostate: Secondary | ICD-10-CM | POA: Diagnosis not present

## 2022-11-27 DIAGNOSIS — R7989 Other specified abnormal findings of blood chemistry: Secondary | ICD-10-CM | POA: Diagnosis not present

## 2022-11-27 DIAGNOSIS — K219 Gastro-esophageal reflux disease without esophagitis: Secondary | ICD-10-CM | POA: Diagnosis not present

## 2022-11-27 DIAGNOSIS — E7841 Elevated Lipoprotein(a): Secondary | ICD-10-CM | POA: Diagnosis not present

## 2022-12-04 DIAGNOSIS — Z1331 Encounter for screening for depression: Secondary | ICD-10-CM | POA: Diagnosis not present

## 2022-12-04 DIAGNOSIS — R82998 Other abnormal findings in urine: Secondary | ICD-10-CM | POA: Diagnosis not present

## 2022-12-04 DIAGNOSIS — I10 Essential (primary) hypertension: Secondary | ICD-10-CM | POA: Diagnosis not present

## 2022-12-04 DIAGNOSIS — I712 Thoracic aortic aneurysm, without rupture, unspecified: Secondary | ICD-10-CM | POA: Diagnosis not present

## 2022-12-04 DIAGNOSIS — Z0000 Encounter for general adult medical examination without abnormal findings: Secondary | ICD-10-CM | POA: Diagnosis not present

## 2022-12-04 DIAGNOSIS — C61 Malignant neoplasm of prostate: Secondary | ICD-10-CM | POA: Diagnosis not present

## 2022-12-04 DIAGNOSIS — K219 Gastro-esophageal reflux disease without esophagitis: Secondary | ICD-10-CM | POA: Diagnosis not present

## 2022-12-04 DIAGNOSIS — Z1339 Encounter for screening examination for other mental health and behavioral disorders: Secondary | ICD-10-CM | POA: Diagnosis not present

## 2022-12-04 DIAGNOSIS — E7849 Other hyperlipidemia: Secondary | ICD-10-CM | POA: Diagnosis not present

## 2023-02-05 ENCOUNTER — Telehealth: Payer: Self-pay | Admitting: Cardiovascular Disease

## 2023-02-05 NOTE — Telephone Encounter (Addendum)
Pt stated he will like referral to [facility] for his wife Jethro Radke  date of birth 09/06/1937. Pt stated he his wife did not contact PCP for referral because he trust Dr. Elease Hashimoto recommendation. Pt stated he will like for Dr. Elease Hashimoto to call him at  [phone]. Will forward to MD and nurse for advise.

## 2023-02-05 NOTE — Telephone Encounter (Signed)
Patient would like to be referral to ear, nose, and throat Dr. Please advise

## 2023-02-06 NOTE — Telephone Encounter (Signed)
Dr Elease Hashimoto called and spoke personally with patient during clinic and provided name of Dr Beverlee Nims

## 2023-02-09 ENCOUNTER — Other Ambulatory Visit: Payer: Self-pay | Admitting: Cardiovascular Disease

## 2023-03-19 DIAGNOSIS — H401132 Primary open-angle glaucoma, bilateral, moderate stage: Secondary | ICD-10-CM | POA: Diagnosis not present

## 2023-04-08 NOTE — Progress Notes (Unsigned)
Cardiology Office Note   Date:  04/09/2023   ID:  Dwayne Morris, DOB [birth year], MRN [id number]  PCP:  Haydee Salter, MD   Cardiologist:   Kristeen Miss, MD   Chief Complaint  Patient presents with   Hypertension        1. Type III aortic dissection 2. Hypertension 3. Prostate cancer 4. 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  gentleman originally from [place]. He is done quite well since I last saw him 6 months ago. [name] not had any problems with his blood pressure. [name] been taking his medications the records his blood pressure on a regular basis.  He has a history of an aortic dissection. [name] not had any further episodes of chest pain. We have followed him with CT scans and his most recent CT scans have not shown any worsening of his dissection.  February 24, 2013:  Dwayne Morris is doing well.  Still swiimming regularly.  No CP , no dyspnea.   His CT angio of the aorta which was unremarkable.    He has been found to have a left inguinal hernia.   Jan. 12, 2015:  Dwayne Morris is doing well.  He has been to [place] since last saw him.  Still swimming reguarly.    April 14, 2014:  Doing well.  No CP or issues.  Still swimming regulalry.    He has noticed that on occasion is lower than 100 - he typically holds his labetalol .  The following morning the blood pressures usually normal.   Feb. 18, 2016:   Dwayne Morris is a 89 y.o. male who presents for follow up of his aortic dissection.  His home BP readings are all good.  He swims 1/2 mile a day 3 days a week.  Walks quite a bit.  Also is active in his gardent  Has some occasional back pain if he works in his garden  Sept. 8, 2016:  Doing well.  Has some "stuffiness in his chest "  ,  Seems to be more pronounced if he is stress.  Goes away when he walks around the block   We discussed his vertebral artery dissection that occurred 20 years ago .  was hospitalized . Still has some left sided weakness  adn "stiffness"   His son , [name], noticed that he occasionally has some facial drooping . He has discussed with Dr. Selena Batten.   Dr. Selena Batten suggest   Brought his BP log.  All his readings are normal  Oct. 25, 2016:  Dwayne Morris is seen today for some chest discomfort. Chest pressure , minor Feels better after swimming or walking . Not worsened with deep breath ( in fact, makes it feel better ) Not worsened with arm movement  calastinics seem to help   Mostly on the left side.  Not associated with sweats or dyspnea.  Is scheduled to have colonoscopy on Nov. 4 ( Dr. Elnoria [facility])  [facility] to hold his ASA for a week prior to colonoscopy . Has had polys removed in the past - complicated by a lower GI bleed. Apparently a clip had come loose.   December 20, 2015:  Dwayne Morris is doing well .  BP is stable.   Brought his BP log.  Swimming regularly   Oct. 23, 2017: Feeling well Still swimming 3 times a week - 1/2 mile .  Also walks 3 miles 3 times  a week.  Brought his BP log .  Dec 26, 2016: Dwayne Morris is doing well Swims regularly .   Walks 3 miles a day also  No CP or dyspnea.  BP records were reviewed .  Was seen by Dr. [name] .  CT at that time showed that the dissection was completely healed.   Jan 15, 2018:   Doing well Shared recent news about his son [name]. BP is great. Brought his BP log  Shared a bottle of  Merlot with me   Nov. 18, 2019:    Doing great We disscussed his son's recent achievements  Feels well  No further CP or abd pain  Eating and drinking regularly  Still swimming .   November 17, 2019:  Dwayne Morris is doing well.  Is no longer swimming,   Walks  3 miles a day . Is seeing Dr. [name] every 2 years .  Brought his BP log  Has had both covid vaccines.   Sept. 20, 2021 Dwayne Morris is doing very well.  Walks 3 miles a day , has had some left lateral leg pain .    Brought his i BP log . Bp readings look good He needs a new primary MD ( Dr. Selena Batten is out on medical leave )   November 14, 2020: Dwayne Morris is doing well.  He  remains very active, Has been walking instead of swimming ( covid restrictions ) Pulled a muscle on 1 occasion,  Has healed.   His las CTA of the chest / abd.   His asc. Aorta measures 4.5 cm   Jan. 27, 2023 Dwayne Morris is seen today for follow up of his HTN, type III aortic dissection 1-2 months ago, he developed a dull mid-sternal chest pain .  He called for appt .  Now the pain is better .  The pain did not feel like his aortic dissection pain  ( Previous pain was sharp lasting mid scapular pain  Is not swimming due to COVID  Now he walks several miles a day 4-5 days a week .  Walks late at night  Also does some exercises in his home .    Aug. 14, 2023 Dwayne Morris is seen today for follow up of his type III aortic dissection, HTN Has been seen by [facility] Laneta [facility]   No Cp or dyspnea  Takes all his meds  Is not swimnning now due to COVID concerns.  Is exercising in the pool  Is walking regularly    Has some vague MSK pain  Advised tylenol or ibuprofen   Oct 08, 2022 Dwayne Morris is seen for follow up of his aortic dissection , HTN Is back swimming 3 times a week , used to swim 1/2 mile ( 18 laps)  Now swims 15 laps ( 30 lengths of a 25 meter pool )  Walks 2-3 times a week.   Rarely has a glass of wine  - perhaps every other day   Aug.  13, 2024 Dwayne Morris is seen for follow up of his aortic dissection , HTN Swimming every other day about 40 minutes each time  No cp BP log shows normal BP   He would like to follow up with Dr. Laneta Simmers Dr. Garen Grams last note indicates that he would not likely be a candidate for surgical repair and that he likely does not need any further CT scans.        Past Medical History:  Diagnosis Date   Aortic dissection ([facility])  type B aortic dissection in November 2008   Hypertension    Prostate cancer [facility])    PSA levels have decreased with garlic therapy   Stroke [facility]) 1996   History of right vertebral artery dissection with stroke   Thoracic aortic aneurysm  [facility])     Past Surgical History:  Procedure Laterality Date   arotic discection      2012    COLONOSCOPY  09/14/2011   Procedure: COLONOSCOPY;  Surgeon: Theda Belfast, MD;  Location: [facility] END[facility]COPY;  Service: Endoscopy;  Laterality: N/A;   COLONOSCOPY WITH PROPOFOL N/A 07/01/2015   Procedure: COLONOSCOPY WITH PROPOFOL;  Surgeon: Jeani Hawking, MD;  Location: [facility];  Service: Endoscopy;  Laterality: N/A;   EYE SURGERY     catract     Current Outpatient Medications  Medication Sig Dispense Refill   ascorbic acid (VITAMIN C) 1000 MG tablet Take 2,000 mg by mouth 3 (three) times daily.      aspirin 81 MG tablet Take 81 mg by mouth at bedtime.     atorvastatin (LIPITOR) 20 MG tablet TAKE 1 TABLET BY MOUTH EVERYDAY AT BEDTIME 90 tablet 3   Cholecalciferol (VITAMIN D-3 PO) Take 6,000 mg by mouth daily.     Choline Fenofibrate (FENOFIBRIC ACID) 135 MG CPDR Take 1 tablet by mouth daily. 90 capsule 3   COLLAGEN PO Take 6,000 mg by mouth daily.     Cyanocobalamin (VITAMIN B-12 PO) Take 1 capsule by mouth daily in the afternoon.     esomeprazole (NEXIUM) 40 MG capsule Take 40 mg by mouth daily before breakfast.     finasteride (PROSCAR) 5 MG tablet Take 5 mg by mouth daily.     fish oil-omega-3 fatty acids 1000 MG capsule Take 1,000 mg by mouth daily.      labetalol (NORMODYNE) 100 MG tablet Take 1 tablet (100 mg total) by mouth 2 (two) times daily. 180 tablet 1   latanoprost (XALATAN) 0.005 % ophthalmic solution Place 1 drop into both eyes at bedtime.      Multiple Vitamin (MULITIVITAMIN WITH MINERALS) TABS Take 1 tablet by mouth daily.     polyvinyl alcohol (LIQUIFILM TEARS) 1.4 % ophthalmic solution Place 1 drop into both eyes every 8 (eight) hours as needed for dry eyes.     VITAMIN E PO Take 400 Units by mouth daily.     Quercetin 250 MG TABS 2 tablets Orally Once a day (Patient not taking: Reported on 04/09/2022)     No current facility-administered medications for this visit.     Allergies:   Patient has no known allergies.    Social History:  The patient  reports that he quit smoking about 48 years ago. His smoking use included cigarettes. He started smoking about 78 years ago. He has a 30 pack-year smoking history. He has never used smokeless tobacco. He reports current alcohol use of about 4.0 standard drinks of alcohol per week. He reports that he does not use drugs.   Family History:  The patient's family history is not on file.    ROS:   Noted in current hx    Physical Exam: Blood pressure 124/60, pulse 65, height 5\' 3"  (1.6 m), weight 123 lb 6.4 oz (56 kg), SpO2 95%.      GEN:  Well nourished, well developed in no acute distress HEENT: Normal NECK: No JVD; No carotid bruits LYMPHATICS: No lymphadenopathy CARDIAC: RRR , no murmurs, rubs, gallops RESPIRATORY:  Clear to auscultation without rales, wheezing  or rhonchi  ABDOMEN: Soft, non-tender, non-distended MUSCULOSKELETAL:  No edema; No deformity  SKIN: Warm and dry NEUROLOGIC:  Alert and oriented x 3     EKG:            Recent Labs: No results found for requested labs within last 365 days.    Lipid Panel    Component Value Date/Time   CHOL 126 12/24/2016 1621   TRIG 47 12/24/2016 1621   HDL 61 12/24/2016 1621   CHOLHDL 2.1 12/24/2016 1621   CHOLHDL 1.9 11/24/2015 1541   VLDL 11 11/24/2015 1541   LDLCALC 56 12/24/2016 1621      Wt Readings from Last 3 Encounters:  04/09/23 123 lb 6.4 oz (56 kg)  10/08/22 127 lb 6.4 oz (57.8 kg)  04/09/22 128 lb 6.4 oz (58.2 kg)      Other studies Reviewed: Additional studies/ records that were reviewed today include: . Review of the above records demonstrates:       ASSESSMENT AND PLAN:  1. Type III aortic dissection -     stable .   Cont current meds.  He is seeing the CV surgeons in the past.  He would like to see the CV surgeons again.  Given his age, he is not likely to be a surgical candidate    2. Hypertension  -  Well  controlled.  Cont meds.     3. Prostate cancer - stable       Current medicines are reviewed at length with the patient today.  The patient does not have concerns regarding medicines.  The following changes have been made:  no change  Disposition:       Signed, Kristeen Miss, MD  04/09/2023 2:29 PM    [facility] [address] [place], [place], [place]  [address] Phone: [phone]; Fax: [phone]

## 2023-04-09 ENCOUNTER — Ambulatory Visit: Payer: Medicare PPO | Attending: Cardiovascular Disease | Admitting: Cardiovascular Disease

## 2023-04-09 ENCOUNTER — Encounter: Payer: Self-pay | Admitting: Cardiovascular Disease

## 2023-04-09 VITALS — BP 124/60 | HR 65 | Ht 63.0 in | Wt 123.4 lb

## 2023-04-09 DIAGNOSIS — I712 Thoracic aortic aneurysm, without rupture, unspecified: Secondary | ICD-10-CM | POA: Diagnosis not present

## 2023-04-09 DIAGNOSIS — I10 Essential (primary) hypertension: Secondary | ICD-10-CM | POA: Diagnosis not present

## 2023-04-09 NOTE — Patient Instructions (Signed)
Medication Instructions:  Your physician recommends that you continue on your current medications as directed. Please refer to the Current Medication list given to you today.  *If you need a refill on your cardiac medications before your next appointment, please call your pharmacy*   Lab Work: NONE If you have labs (blood work) drawn today and your tests are completely normal, you will receive your results only by: MyChart Message (if you have MyChart) OR A paper copy in the mail If you have any lab test that is abnormal or we need to change your treatment, we will call you to review the results.   Testing/Procedures: NONE- Will contact Dr Sharee Pimple office to discuss potential repeat CT next year (performed every 2 years)  Follow-Up: At [facility], you and your health needs are our priority.  As part of our continuing mission to provide you with exceptional heart care, we have created designated Provider Care Teams.  These Care Teams include your primary Cardiologist (physician) and Advanced Practice Providers (APPs -  Physician Assistants and Nurse Practitioners) who all work together to provide you with the care you need, when you need it.  Your next appointment:   6 month(s)  Provider:   Kristeen Miss, MD

## 2023-05-03 ENCOUNTER — Other Ambulatory Visit: Payer: Self-pay | Admitting: Cardiovascular Disease

## 2023-07-10 DIAGNOSIS — H401132 Primary open-angle glaucoma, bilateral, moderate stage: Secondary | ICD-10-CM | POA: Diagnosis not present

## 2023-07-15 DIAGNOSIS — C61 Malignant neoplasm of prostate: Secondary | ICD-10-CM | POA: Diagnosis not present

## 2023-07-22 DIAGNOSIS — N401 Enlarged prostate with lower urinary tract symptoms: Secondary | ICD-10-CM | POA: Diagnosis not present

## 2023-07-22 DIAGNOSIS — R351 Nocturia: Secondary | ICD-10-CM | POA: Diagnosis not present

## 2023-07-22 DIAGNOSIS — C61 Malignant neoplasm of prostate: Secondary | ICD-10-CM | POA: Diagnosis not present

## 2023-08-23 ENCOUNTER — Other Ambulatory Visit: Payer: Self-pay | Admitting: Cardiovascular Disease

## 2023-09-14 DIAGNOSIS — W228XXA Striking against or struck by other objects, initial encounter: Secondary | ICD-10-CM | POA: Diagnosis not present

## 2023-09-14 DIAGNOSIS — S01411A Laceration without foreign body of right cheek and temporomandibular area, initial encounter: Secondary | ICD-10-CM | POA: Diagnosis not present

## 2023-09-17 DIAGNOSIS — T8522XA Displacement of intraocular lens, initial encounter: Secondary | ICD-10-CM | POA: Diagnosis not present

## 2023-09-18 DIAGNOSIS — T8522XA Displacement of intraocular lens, initial encounter: Secondary | ICD-10-CM | POA: Diagnosis not present

## 2023-09-18 DIAGNOSIS — H35373 Puckering of macula, bilateral: Secondary | ICD-10-CM | POA: Diagnosis not present

## 2023-09-18 DIAGNOSIS — H2702 Aphakia, left eye: Secondary | ICD-10-CM | POA: Diagnosis not present

## 2023-09-18 DIAGNOSIS — H353132 Nonexudative age-related macular degeneration, bilateral, intermediate dry stage: Secondary | ICD-10-CM | POA: Diagnosis not present

## 2023-09-18 DIAGNOSIS — H43393 Other vitreous opacities, bilateral: Secondary | ICD-10-CM | POA: Diagnosis not present

## 2023-09-18 DIAGNOSIS — H43813 Vitreous degeneration, bilateral: Secondary | ICD-10-CM | POA: Diagnosis not present

## 2023-09-20 ENCOUNTER — Other Ambulatory Visit (HOSPITAL_BASED_OUTPATIENT_CLINIC_OR_DEPARTMENT_OTHER): Payer: Self-pay

## 2023-09-25 DIAGNOSIS — H35412 Lattice degeneration of retina, left eye: Secondary | ICD-10-CM | POA: Diagnosis not present

## 2023-09-25 DIAGNOSIS — T8522XA Displacement of intraocular lens, initial encounter: Secondary | ICD-10-CM | POA: Diagnosis not present

## 2023-10-03 DIAGNOSIS — Z9889 Other specified postprocedural states: Secondary | ICD-10-CM | POA: Diagnosis not present

## 2023-10-03 DIAGNOSIS — T8522XD Displacement of intraocular lens, subsequent encounter: Secondary | ICD-10-CM | POA: Diagnosis not present

## 2023-10-03 DIAGNOSIS — H43811 Vitreous degeneration, right eye: Secondary | ICD-10-CM | POA: Diagnosis not present

## 2023-10-03 DIAGNOSIS — H35412 Lattice degeneration of retina, left eye: Secondary | ICD-10-CM | POA: Diagnosis not present

## 2023-10-21 DIAGNOSIS — Z9889 Other specified postprocedural states: Secondary | ICD-10-CM | POA: Diagnosis not present

## 2023-10-21 DIAGNOSIS — T8522XD Displacement of intraocular lens, subsequent encounter: Secondary | ICD-10-CM | POA: Diagnosis not present

## 2023-10-21 DIAGNOSIS — H43811 Vitreous degeneration, right eye: Secondary | ICD-10-CM | POA: Diagnosis not present

## 2023-10-21 DIAGNOSIS — H35412 Lattice degeneration of retina, left eye: Secondary | ICD-10-CM | POA: Diagnosis not present

## 2023-11-06 DIAGNOSIS — H04123 Dry eye syndrome of bilateral lacrimal glands: Secondary | ICD-10-CM | POA: Diagnosis not present

## 2023-11-06 DIAGNOSIS — H5213 Myopia, bilateral: Secondary | ICD-10-CM | POA: Diagnosis not present

## 2023-11-06 DIAGNOSIS — Z961 Presence of intraocular lens: Secondary | ICD-10-CM | POA: Diagnosis not present

## 2023-11-06 DIAGNOSIS — H401132 Primary open-angle glaucoma, bilateral, moderate stage: Secondary | ICD-10-CM | POA: Diagnosis not present

## 2023-11-14 DIAGNOSIS — T8522XD Displacement of intraocular lens, subsequent encounter: Secondary | ICD-10-CM | POA: Diagnosis not present

## 2023-11-14 DIAGNOSIS — H35412 Lattice degeneration of retina, left eye: Secondary | ICD-10-CM | POA: Diagnosis not present

## 2023-11-14 DIAGNOSIS — Z9889 Other specified postprocedural states: Secondary | ICD-10-CM | POA: Diagnosis not present

## 2023-11-14 DIAGNOSIS — H43811 Vitreous degeneration, right eye: Secondary | ICD-10-CM | POA: Diagnosis not present

## 2023-11-19 ENCOUNTER — Telehealth: Payer: Self-pay | Admitting: Cardiovascular Disease

## 2023-11-19 NOTE — Telephone Encounter (Signed)
 Called pt advised I don't see where a nurse from our office called.  Pt reports he needs to schedule an OV with Dr. Elease Hashimoto.  Scheduled OV for 12/24/23 at 2:40 pm.  Appointment reminder and magnet with new address mailed to patient.  I also verbally gave pt the office new address.

## 2023-11-19 NOTE — Telephone Encounter (Signed)
 Pt states he's returning a call from the nurse. Please advise

## 2023-12-10 DIAGNOSIS — E7849 Other hyperlipidemia: Secondary | ICD-10-CM | POA: Diagnosis not present

## 2023-12-10 DIAGNOSIS — C61 Malignant neoplasm of prostate: Secondary | ICD-10-CM | POA: Diagnosis not present

## 2023-12-10 DIAGNOSIS — I10 Essential (primary) hypertension: Secondary | ICD-10-CM | POA: Diagnosis not present

## 2023-12-10 DIAGNOSIS — K219 Gastro-esophageal reflux disease without esophagitis: Secondary | ICD-10-CM | POA: Diagnosis not present

## 2023-12-10 DIAGNOSIS — E7841 Elevated Lipoprotein(a): Secondary | ICD-10-CM | POA: Diagnosis not present

## 2023-12-17 DIAGNOSIS — I712 Thoracic aortic aneurysm, without rupture, unspecified: Secondary | ICD-10-CM | POA: Diagnosis not present

## 2023-12-17 DIAGNOSIS — Z1339 Encounter for screening examination for other mental health and behavioral disorders: Secondary | ICD-10-CM | POA: Diagnosis not present

## 2023-12-17 DIAGNOSIS — I10 Essential (primary) hypertension: Secondary | ICD-10-CM | POA: Diagnosis not present

## 2023-12-17 DIAGNOSIS — C61 Malignant neoplasm of prostate: Secondary | ICD-10-CM | POA: Diagnosis not present

## 2023-12-17 DIAGNOSIS — K219 Gastro-esophageal reflux disease without esophagitis: Secondary | ICD-10-CM | POA: Diagnosis not present

## 2023-12-17 DIAGNOSIS — Z1331 Encounter for screening for depression: Secondary | ICD-10-CM | POA: Diagnosis not present

## 2023-12-17 DIAGNOSIS — R82998 Other abnormal findings in urine: Secondary | ICD-10-CM | POA: Diagnosis not present

## 2023-12-17 DIAGNOSIS — E7849 Other hyperlipidemia: Secondary | ICD-10-CM | POA: Diagnosis not present

## 2023-12-17 DIAGNOSIS — Z0000 Encounter for general adult medical examination without abnormal findings: Secondary | ICD-10-CM | POA: Diagnosis not present

## 2023-12-24 ENCOUNTER — Ambulatory Visit: Admitting: Cardiovascular Disease

## 2023-12-24 DIAGNOSIS — H401132 Primary open-angle glaucoma, bilateral, moderate stage: Secondary | ICD-10-CM | POA: Diagnosis not present

## 2023-12-24 DIAGNOSIS — H04123 Dry eye syndrome of bilateral lacrimal glands: Secondary | ICD-10-CM | POA: Diagnosis not present

## 2023-12-30 ENCOUNTER — Other Ambulatory Visit: Payer: Self-pay | Admitting: Cardiovascular Disease

## 2024-02-10 DIAGNOSIS — R059 Cough, unspecified: Secondary | ICD-10-CM | POA: Diagnosis not present

## 2024-02-16 ENCOUNTER — Encounter: Payer: Self-pay | Admitting: Cardiovascular Disease

## 2024-02-16 NOTE — Progress Notes (Unsigned)
 Cardiology Office Note   Date:  02/17/2024   ID:  Dwayne Morris, DOB [birth year], MRN [id number]  PCP:  Dwayne Manus HERO, MD   Cardiologist:   Aleene Passe, MD   Chief Complaint  Patient presents with   Hypertension        Thoracic Aortic Dissection   1. Type III aortic dissection 2. Hypertension 3. Prostate cancer 4. 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  gentleman originally from [place]. He is done quite well since I last saw him 6 months ago. [name] not had any problems with his blood pressure. [name] been taking his medications the records his blood pressure on a regular basis.  He has a history of an aortic dissection. [name] not had any further episodes of chest pain. We have followed him with CT scans and his most recent CT scans have not shown any worsening of his dissection.  February 24, 2013:  Dwayne Morris is doing well.  Still swiimming regularly.  No CP , no dyspnea.   His CT angio of the aorta which was unremarkable.    He has been found to have a left inguinal hernia.   Jan. 12, 2015:  Dwayne Morris is doing well.  He has been to [place]  since last saw him.  Still swimming reguarly.    April 14, 2014:  Doing well.  No CP or issues.  Still swimming regulalry.    He has noticed that on occasion is lower than 100 - he typically holds his labetalol  .  The following morning the blood pressures usually normal.   Feb. 18, 2016:   Dwayne Morris is a 89 y.o. male who presents for follow up of his aortic dissection.  His home BP readings are all good.  He swims 1/2 mile a day 3 days a week.  Walks quite a bit.  Also is active in his gardent  Has some occasional back pain if he works in his garden  Sept. 8, 2016:  Doing well.  Has some stuffiness in his chest   ,  Seems to be more pronounced if he is stress.  Goes away when he walks around the block   We discussed his vertebral artery dissection that occurred 20 years ago .  was hospitalized  . Still has some left sided weakness adn stiffness   His son , Dwayne Morris, noticed that he occasionally has some facial drooping . He has discussed with Dr. Luke.   Dr. Luke suggest   Brought his BP log.  All his readings are normal  Oct. 25, 2016:  Dwayne Morris is seen today for some chest discomfort. Chest pressure , minor Feels better after swimming or walking . Not worsened with deep breath ( in fact, makes it feel better ) Not worsened with arm movement  calastinics seem to help   Mostly on the left side.  Not associated with sweats or dyspnea.  Is scheduled to have colonoscopy on Nov. 4 ( Dr. Rollin)  [facility] to hold his ASA for a week prior to colonoscopy . Has had polys removed in the past - complicated by a lower GI bleed. Apparently a clip had come loose.   December 20, 2015:  Dwayne Morris is doing well .  BP is stable.   Brought his BP log.  Swimming regularly   Oct. 23, 2017: Feeling well Still swimming 3 times a week - 1/2 mile .  Also  walks 3 miles 3 times a week.  Brought his BP log .  Dec 26, 2016: Dwayne Morris is doing well Swims regularly .   Walks 3 miles a day also  No CP or dyspnea.  BP records were reviewed .  Was seen by Dr. Dusty .  CT at that time showed that the dissection was completely healed.   Jan 15, 2018:   Doing well Shared recent news about his son Dwayne Morris. BP is great. Brought his BP log  Shared a bottle of  Merlot with me   Nov. 18, 2019:    Doing great We disscussed his son's recent achievements  Feels well  No further CP or abd pain  Eating and drinking regularly  Still swimming .   November 17, 2019:  Dwayne Morris is doing well.  Is no longer swimming,   Walks  3 miles a day . Is seeing Dr. Dusty every 2 years .  Brought his BP log  Has had both covid vaccines.   Sept. 20, 2021 Dwayne Morris is doing very well.  Walks 3 miles a day , has had some left lateral leg pain .    Brought his i BP log . Bp readings look good He needs a new primary MD ( Dr. Luke is out on medical leave )    November 14, 2020: Dwayne Morris is doing well.  He remains very active, Has been walking instead of swimming ( covid restrictions ) Pulled a muscle on 1 occasion,  Has healed.   His las CTA of the chest / abd.   His asc. Aorta measures 4.5 cm   Jan. 27, 2023 Dwayne Morris is seen today for follow up of his HTN, type III aortic dissection 1-2 months ago, he developed a dull mid-sternal chest pain .  He called for appt .  Now the pain is better .  The pain did not feel like his aortic dissection pain  ( Previous pain was sharp lasting mid scapular pain  Is not swimming due to COVID  Now he walks several miles a day 4-5 days a week .  Walks late at night  Also does some exercises in his home .    Aug. 14, 2023 Dwayne Morris is seen today for follow up of his type III aortic dissection, HTN Has been seen by Dr. Lucas at [facility]   No Cp or dyspnea  Takes all his meds  Is not swimnning now due to COVID concerns.  Is exercising in the pool  Is walking regularly    Has some vague MSK pain  Advised tylenol or ibuprofen   Oct 08, 2022 Dwayne Morris is seen for follow up of his aortic dissection , HTN Is back swimming 3 times a week , used to swim 1/2 mile ( 18 laps)  Now swims 15 laps ( 30 lengths of a 25 meter pool )  Walks 2-3 times a week.   Rarely has a glass of wine  - perhaps every other day   Aug.  13, 2024 Dwayne Morris is seen for follow up of his aortic dissection , HTN Swimming every other day about 40 minutes each time  No cp BP log shows normal BP   He would like to follow up with Dr. Lucas Dr. Harden last note indicates that he would not likely be a candidate for surgical repair and that he likely does not need any further CT scans.   February 17, 2024 Dwayne Morris is seen for follow up of his  type III aortic dissection, HTN He has remained very active  Swims regularly   His wife had a stroke this past year   Dwayne Morris, received his [place] star on the walk of fame sidewalk   Dwayne Morris is feeling well   Past Medical History:   Diagnosis Date   Aortic dissection (HCC)    type B aortic dissection in November 2008   Hypertension    Prostate cancer [facility])    PSA levels have decreased with garlic therapy   Stroke [facility]) 1996   History of right vertebral artery dissection with stroke   Thoracic aortic aneurysm [facility])     Past Surgical History:  Procedure Laterality Date   arotic discection      2012    COLONOSCOPY  09/14/2011   Procedure: COLONOSCOPY;  Surgeon: Belvie JONETTA Just, MD;  Location: [facility] ENDOSCOPY;  Service: Endoscopy;  Laterality: N/A;   COLONOSCOPY WITH PROPOFOL  N/A 07/01/2015   Procedure: COLONOSCOPY WITH PROPOFOL ;  Surgeon: Belvie Just, MD;  Location: [facility] ENDOSCOPY;  Service: Endoscopy;  Laterality: N/A;   EYE SURGERY     catract     Current Outpatient Medications  Medication Sig Dispense Refill   ascorbic acid (VITAMIN C) 1000 MG tablet Take 2,000 mg by mouth 3 (three) times daily.      aspirin 81 MG tablet Take 81 mg by mouth at bedtime.     Choline Fenofibrate  (FENOFIBRIC ACID ) 135 MG CPDR TAKE 1 CAPSULE BY MOUTH EVERY DAY 90 capsule 2   COLLAGEN PO Take 6,000 mg by mouth daily.     esomeprazole (NEXIUM) 40 MG capsule Take 40 mg by mouth daily before breakfast.     finasteride (PROSCAR) 5 MG tablet Take 5 mg by mouth daily.     fish oil-omega-3 fatty acids 1000 MG capsule Take 1,000 mg by mouth daily.      labetalol  (NORMODYNE ) 100 MG tablet TAKE 1 TABLET BY MOUTH TWICE A DAY 180 tablet 2   latanoprost (XALATAN) 0.005 % ophthalmic solution Place 1 drop into both eyes at bedtime.      Multiple Vitamin (MULITIVITAMIN WITH MINERALS) TABS Take 1 tablet by mouth daily.     VITAMIN E PO Take 400 Units by mouth daily.     atorvastatin  (LIPITOR) 20 MG tablet TAKE 1 TABLET BY MOUTH EVERYDAY AT BEDTIME (Patient not taking: Reported on 02/17/2024) 90 tablet 0   Cholecalciferol (VITAMIN D-3 PO) Take 6,000 mg by mouth daily. (Patient not taking: Reported on 02/17/2024)     Cyanocobalamin  (VITAMIN B-12 PO) Take  1 capsule by mouth daily in the afternoon. (Patient not taking: Reported on 02/17/2024)     polyvinyl alcohol  (LIQUIFILM TEARS) 1.4 % ophthalmic solution Place 1 drop into both eyes every 8 (eight) hours as needed for dry eyes. (Patient not taking: Reported on 02/17/2024)     Quercetin 250 MG TABS 2 tablets Orally Once a day (Patient not taking: Reported on 02/17/2024)     No current facility-administered medications for this visit.    Allergies:   Patient has no known allergies.    Social History:  The patient  reports that he quit smoking about 49 years ago. His smoking use included cigarettes. He started smoking about 79 years ago. He has a 30 pack-year smoking history. He has never used smokeless tobacco. He reports current alcohol  use of about 4.0 standard drinks of alcohol  per week. He reports that he does not use drugs.   Family History:  The patient's family history  is not on file.    ROS:   Noted in current hx     Physical Exam: Blood pressure 126/62, pulse 67, height 5' 3 (1.6 m), weight 120 lb 12.8 oz (54.8 kg), SpO2 97%.       GEN:  Well nourished, well developed in no acute distress HEENT: Normal NECK: No JVD; No carotid bruits LYMPHATICS: No lymphadenopathy CARDIAC: RRR , no murmurs, rubs, gallops RESPIRATORY:  Clear to auscultation without rales, wheezing or rhonchi  ABDOMEN: Soft, non-tender, non-distended MUSCULOSKELETAL:  No edema; No deformity  SKIN: Warm and dry NEUROLOGIC:  Alert and oriented x 3     EKG:        EKG Interpretation Date/Time:  Monday February 17 2024 14:47:51 EDT Ventricular Rate:  68 PR Interval:  214 QRS Duration:  100 QT Interval:  402 QTC Calculation: 427 R Axis:   56  Text Interpretation: Sinus rhythm with 1st degree A-V block When compared with ECG of 04-Aug-2007 09:38, PR interval has increased Confirmed by Alveta Mungo ([id number]) on 02/17/2024 5:31:42 PM      Recent Labs: No results found for requested labs within last 365  days.    Lipid Panel    Component Value Date/Time   CHOL 126 12/24/2016 1621   TRIG 47 12/24/2016 1621   HDL 61 12/24/2016 1621   CHOLHDL 2.1 12/24/2016 1621   CHOLHDL 1.9 11/24/2015 1541   VLDL 11 11/24/2015 1541   LDLCALC 56 12/24/2016 1621      Wt Readings from Last 3 Encounters:  02/17/24 120 lb 12.8 oz (54.8 kg)  04/09/23 123 lb 6.4 oz (56 kg)  10/08/22 127 lb 6.4 oz (57.8 kg)      Other studies Reviewed: Additional studies/ records that were reviewed today include: . Review of the above records demonstrates:       ASSESSMENT AND PLAN:  1. Type III aortic dissection -      Stable,  no chest pain ,  The dissection has been stable for years  Continue current medical therapy    2. Hypertension  -   well controlled.      3. Prostate cancer -       Current medicines are reviewed at length with the patient today.  The patient does not have concerns regarding medicines.  The following changes have been made:  no change  Disposition:       Signed, Mungo Alveta, MD  02/17/2024 3:16 PM    [facility] [address] [place], [place], [place]  [address] Phone: [phone]; Fax: [phone]

## 2024-02-17 ENCOUNTER — Ambulatory Visit: Attending: Cardiovascular Disease | Admitting: Cardiovascular Disease

## 2024-02-17 ENCOUNTER — Encounter: Payer: Self-pay | Admitting: Cardiovascular Disease

## 2024-02-17 VITALS — BP 126/62 | HR 67 | Ht 63.0 in | Wt 120.8 lb

## 2024-02-17 DIAGNOSIS — I712 Thoracic aortic aneurysm, without rupture, unspecified: Secondary | ICD-10-CM

## 2024-02-17 DIAGNOSIS — I10 Essential (primary) hypertension: Secondary | ICD-10-CM | POA: Diagnosis not present

## 2024-02-17 NOTE — Patient Instructions (Signed)
 Follow-Up: At [facility], you and your health needs are our priority.  As part of our continuing mission to provide you with exceptional heart care, our providers are all part of one team.  This team includes your primary Cardiologist (physician) and Advanced Practice Providers or APPs (Physician Assistants and Nurse Practitioners) who all work together to provide you with the care you need, when you need it.  Your next appointment:   1 year(s)  Provider:   Lynwood Schilling, MD  We recommend signing up for the patient portal called MyChart.  Sign up information is provided on this After Visit Summary.  MyChart is used to connect with patients for Virtual Visits (Telemedicine).  Patients are able to view lab/test results, encounter notes, upcoming appointments, etc.  Non-urgent messages can be sent to your provider as well.   To learn more about what you can do with MyChart, go to [email or web address].

## 2024-03-25 DIAGNOSIS — H35373 Puckering of macula, bilateral: Secondary | ICD-10-CM | POA: Diagnosis not present

## 2024-03-25 DIAGNOSIS — H353132 Nonexudative age-related macular degeneration, bilateral, intermediate dry stage: Secondary | ICD-10-CM | POA: Diagnosis not present

## 2024-03-25 DIAGNOSIS — H401132 Primary open-angle glaucoma, bilateral, moderate stage: Secondary | ICD-10-CM | POA: Diagnosis not present

## 2024-03-25 DIAGNOSIS — H04123 Dry eye syndrome of bilateral lacrimal glands: Secondary | ICD-10-CM | POA: Diagnosis not present

## 2024-03-26 DIAGNOSIS — H401134 Primary open-angle glaucoma, bilateral, indeterminate stage: Secondary | ICD-10-CM | POA: Diagnosis not present

## 2024-03-26 DIAGNOSIS — H35373 Puckering of macula, bilateral: Secondary | ICD-10-CM | POA: Diagnosis not present

## 2024-03-26 DIAGNOSIS — H31092 Other chorioretinal scars, left eye: Secondary | ICD-10-CM | POA: Diagnosis not present

## 2024-03-26 DIAGNOSIS — H353132 Nonexudative age-related macular degeneration, bilateral, intermediate dry stage: Secondary | ICD-10-CM | POA: Diagnosis not present

## 2024-03-26 DIAGNOSIS — H43391 Other vitreous opacities, right eye: Secondary | ICD-10-CM | POA: Diagnosis not present

## 2024-03-26 DIAGNOSIS — H35033 Hypertensive retinopathy, bilateral: Secondary | ICD-10-CM | POA: Diagnosis not present

## 2024-03-26 DIAGNOSIS — H43811 Vitreous degeneration, right eye: Secondary | ICD-10-CM | POA: Diagnosis not present

## 2024-04-08 ENCOUNTER — Telehealth: Payer: Self-pay | Admitting: Cardiology

## 2024-04-08 NOTE — Telephone Encounter (Signed)
 Pt c/o BP issue: STAT if pt c/o blurred vision, one-sided weakness or slurred speech.   1. What is your BP concern?  BP is high.  2. Have you taken any BP medication today? Yes   3. What are your last 5 BP readings? 148, 143, 129, 143 systolic today.  4. Are you having any other symptoms (ex. Dizziness, headache, blurred vision, passed out)?  Patient mentions having a high temperature a few days ago.

## 2024-04-08 NOTE — Telephone Encounter (Signed)
 Called patient left message on personal voice mail to call back.

## 2024-04-13 NOTE — Telephone Encounter (Signed)
 Left message for pt to call.

## 2024-04-14 NOTE — Telephone Encounter (Signed)
 Attempted to call pt 3 times. Encounter will be closed.

## 2024-04-14 NOTE — Telephone Encounter (Signed)
 Left voice message to call back 8/19

## 2024-06-04 DIAGNOSIS — Z20822 Contact with and (suspected) exposure to covid-19: Secondary | ICD-10-CM | POA: Diagnosis not present

## 2024-07-08 DIAGNOSIS — C61 Malignant neoplasm of prostate: Secondary | ICD-10-CM | POA: Diagnosis not present

## 2024-07-14 DIAGNOSIS — C61 Malignant neoplasm of prostate: Secondary | ICD-10-CM | POA: Diagnosis not present

## 2024-07-14 DIAGNOSIS — R351 Nocturia: Secondary | ICD-10-CM | POA: Diagnosis not present

## 2024-07-14 DIAGNOSIS — N401 Enlarged prostate with lower urinary tract symptoms: Secondary | ICD-10-CM | POA: Diagnosis not present

## 2024-09-15 ENCOUNTER — Telehealth: Payer: Self-pay | Admitting: Cardiology

## 2024-09-15 NOTE — Telephone Encounter (Signed)
" °*  STAT* If patient is at the pharmacy, call can be transferred to refill team.   1. Which medications need to be refilled? (please list name of each medication and dose if known)  Choline Fenofibrate  (FENOFIBRIC ACID ) 135 MG CPDR 2. Which pharmacy/location (including street and city if local pharmacy) is medication to be sent to? [id number] - [place], [place] - [address]  3. Do they need a 30 day or 90 day supply? 90 days   Pt is out of medication. Please advise  "

## 2024-09-16 ENCOUNTER — Other Ambulatory Visit: Payer: Self-pay

## 2024-09-16 MED ORDER — FENOFIBRIC ACID 135 MG PO CPDR: 135.0000 mg | DELAYED_RELEASE_CAPSULE | Freq: Every day | ORAL | 1 refills | Status: AC
